# Patient Record
Sex: Male | Born: 1940 | Race: White | Hispanic: No | Marital: Married | Smoking: Former smoker
Health system: Southern US, Community
[De-identification: ages and names within clinical notes are randomized; demographics above are authoritative.]

## PROBLEM LIST (undated history)

## (undated) DIAGNOSIS — M5430 Sciatica, unspecified side: Secondary | ICD-10-CM

## (undated) DIAGNOSIS — D649 Anemia, unspecified: Secondary | ICD-10-CM

## (undated) DIAGNOSIS — M109 Gout, unspecified: Secondary | ICD-10-CM

## (undated) DIAGNOSIS — H409 Unspecified glaucoma: Secondary | ICD-10-CM

## (undated) DIAGNOSIS — G20A1 Parkinson's disease without dyskinesia, without mention of fluctuations: Secondary | ICD-10-CM

## (undated) DIAGNOSIS — M1711 Unilateral primary osteoarthritis, right knee: Secondary | ICD-10-CM

## (undated) DIAGNOSIS — K219 Gastro-esophageal reflux disease without esophagitis: Secondary | ICD-10-CM

## (undated) DIAGNOSIS — F410 Panic disorder [episodic paroxysmal anxiety] without agoraphobia: Secondary | ICD-10-CM

## (undated) DIAGNOSIS — I10 Essential (primary) hypertension: Secondary | ICD-10-CM

## (undated) DIAGNOSIS — I6523 Occlusion and stenosis of bilateral carotid arteries: Secondary | ICD-10-CM

## (undated) DIAGNOSIS — I714 Abdominal aortic aneurysm, without rupture, unspecified: Secondary | ICD-10-CM

## (undated) DIAGNOSIS — E559 Vitamin D deficiency, unspecified: Secondary | ICD-10-CM

## (undated) DIAGNOSIS — M1990 Unspecified osteoarthritis, unspecified site: Secondary | ICD-10-CM

## (undated) DIAGNOSIS — Z8719 Personal history of other diseases of the digestive system: Secondary | ICD-10-CM

## (undated) DIAGNOSIS — I493 Ventricular premature depolarization: Secondary | ICD-10-CM

## (undated) DIAGNOSIS — I723 Aneurysm of iliac artery: Secondary | ICD-10-CM

## (undated) DIAGNOSIS — E785 Hyperlipidemia, unspecified: Secondary | ICD-10-CM

## (undated) DIAGNOSIS — I739 Peripheral vascular disease, unspecified: Secondary | ICD-10-CM

## (undated) DIAGNOSIS — I639 Cerebral infarction, unspecified: Secondary | ICD-10-CM

## (undated) HISTORY — PX: EYE SURGERY: SHX253

## (undated) HISTORY — PX: NASAL SINUS SURGERY: SHX719

## (undated) HISTORY — PX: BACK SURGERY: SHX140

## (undated) HISTORY — PX: SHOULDER SURGERY: SHX246

## (undated) HISTORY — PX: COLONOSCOPY: SHX174

## (undated) HISTORY — PX: KNEE SURGERY: SHX244

---

## 2003-08-19 ENCOUNTER — Ambulatory Visit (HOSPITAL_COMMUNITY): Admission: RE | Admit: 2003-08-19 | Discharge: 2003-08-19 | Payer: Self-pay | Admitting: *Deleted

## 2003-09-17 HISTORY — PX: ABDOMINAL AORTIC ANEURYSM REPAIR: SUR1152

## 2003-09-27 ENCOUNTER — Inpatient Hospital Stay (HOSPITAL_COMMUNITY): Admission: RE | Admit: 2003-09-27 | Discharge: 2003-10-03 | Payer: Self-pay | Admitting: *Deleted

## 2004-10-05 ENCOUNTER — Ambulatory Visit: Payer: Self-pay | Admitting: Gastroenterology

## 2005-09-27 ENCOUNTER — Other Ambulatory Visit: Payer: Self-pay

## 2005-09-27 ENCOUNTER — Ambulatory Visit: Payer: Self-pay | Admitting: Otolaryngology

## 2005-09-30 ENCOUNTER — Ambulatory Visit: Payer: Self-pay | Admitting: Otolaryngology

## 2006-03-13 ENCOUNTER — Ambulatory Visit: Payer: Self-pay | Admitting: Internal Medicine

## 2007-10-30 ENCOUNTER — Ambulatory Visit: Payer: Self-pay | Admitting: Gastroenterology

## 2013-09-16 DIAGNOSIS — I639 Cerebral infarction, unspecified: Secondary | ICD-10-CM

## 2013-09-16 HISTORY — DX: Cerebral infarction, unspecified: I63.9

## 2014-04-01 ENCOUNTER — Emergency Department: Payer: Self-pay | Admitting: Emergency Medicine

## 2014-04-01 LAB — CBC
HCT: 43.1 % (ref 40.0–52.0)
HGB: 14.4 g/dL (ref 13.0–18.0)
MCH: 31.2 pg (ref 26.0–34.0)
MCHC: 33.3 g/dL (ref 32.0–36.0)
MCV: 94 fL (ref 80–100)
Platelet: 136 10*3/uL — ABNORMAL LOW (ref 150–440)
RBC: 4.61 10*6/uL (ref 4.40–5.90)
RDW: 13.3 % (ref 11.5–14.5)
WBC: 7 10*3/uL (ref 3.8–10.6)

## 2014-04-01 LAB — BASIC METABOLIC PANEL
Anion Gap: 7 (ref 7–16)
BUN: 14 mg/dL (ref 7–18)
Calcium, Total: 8.8 mg/dL (ref 8.5–10.1)
Chloride: 107 mmol/L (ref 98–107)
Co2: 25 mmol/L (ref 21–32)
Creatinine: 1.03 mg/dL (ref 0.60–1.30)
EGFR (African American): >60
EGFR (Non-African Amer.): >60
Glucose: 102 mg/dL — ABNORMAL HIGH (ref 65–99)
Osmolality: 278 (ref 275–301)
Potassium: 4.1 mmol/L (ref 3.5–5.1)
Sodium: 139 mmol/L (ref 136–145)

## 2014-04-01 LAB — TROPONIN I: Troponin-I: <0.02 ng/mL

## 2014-04-06 ENCOUNTER — Ambulatory Visit: Payer: Self-pay | Admitting: Internal Medicine

## 2014-04-07 DIAGNOSIS — I779 Disorder of arteries and arterioles, unspecified: Secondary | ICD-10-CM | POA: Insufficient documentation

## 2014-04-07 DIAGNOSIS — I739 Peripheral vascular disease, unspecified: Secondary | ICD-10-CM

## 2014-04-18 ENCOUNTER — Ambulatory Visit: Payer: Self-pay | Admitting: Internal Medicine

## 2014-04-29 DIAGNOSIS — M5430 Sciatica, unspecified side: Secondary | ICD-10-CM | POA: Insufficient documentation

## 2014-04-29 DIAGNOSIS — Z8673 Personal history of transient ischemic attack (TIA), and cerebral infarction without residual deficits: Secondary | ICD-10-CM | POA: Insufficient documentation

## 2014-04-29 DIAGNOSIS — M79605 Pain in left leg: Secondary | ICD-10-CM | POA: Insufficient documentation

## 2014-04-29 DIAGNOSIS — M549 Dorsalgia, unspecified: Secondary | ICD-10-CM | POA: Insufficient documentation

## 2014-08-03 DIAGNOSIS — Z8673 Personal history of transient ischemic attack (TIA), and cerebral infarction without residual deficits: Secondary | ICD-10-CM | POA: Insufficient documentation

## 2014-09-01 ENCOUNTER — Ambulatory Visit: Payer: Self-pay | Admitting: Neurology

## 2014-10-14 DIAGNOSIS — I714 Abdominal aortic aneurysm, without rupture, unspecified: Secondary | ICD-10-CM | POA: Insufficient documentation

## 2014-10-14 DIAGNOSIS — I10 Essential (primary) hypertension: Secondary | ICD-10-CM | POA: Insufficient documentation

## 2014-10-14 DIAGNOSIS — R7989 Other specified abnormal findings of blood chemistry: Secondary | ICD-10-CM | POA: Insufficient documentation

## 2014-10-18 ENCOUNTER — Ambulatory Visit: Payer: Self-pay | Admitting: Vascular Surgery

## 2015-02-11 IMAGING — CT CT ANGIO ABD-PELV WO/W CM
2 of 7 series · 14 of 46 positions shown, 18 images · IV contrast (APPLIED)
Comparison: MRI September 01, 2014.

CLINICAL DATA: Abdominal aortic aneurysm.

EXAM:
CTA ABDOMEN AND PELVIS wITHOUT AND WITH CONTRAST
TECHNIQUE: Multidetector CT imaging of the abdomen and pelvis was performed
using the standard protocol during bolus administration of
intravenous contrast. Multiplanar reconstructed images and MIPs were
obtained and reviewed to evaluate the vascular anatomy.
CONTRAST:  125 mL of Isovue 370 intravenously.

[Series 5: arterial · axial · arterial · 0.83mm/px · z∈[-456,-66]mm · 11 of 231 slices shown, 15 images]
[im 24/231  soft-tissue]
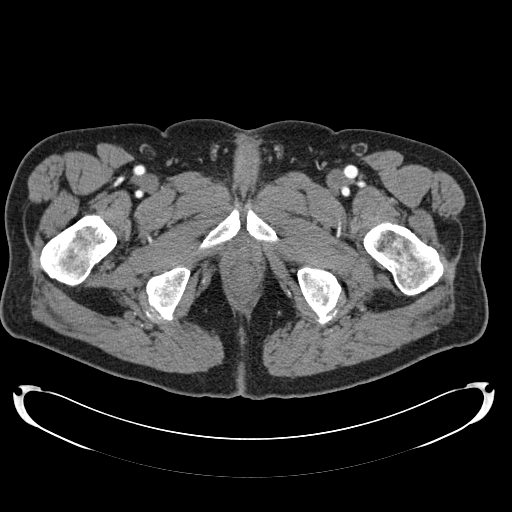
[im 24/231  bone]
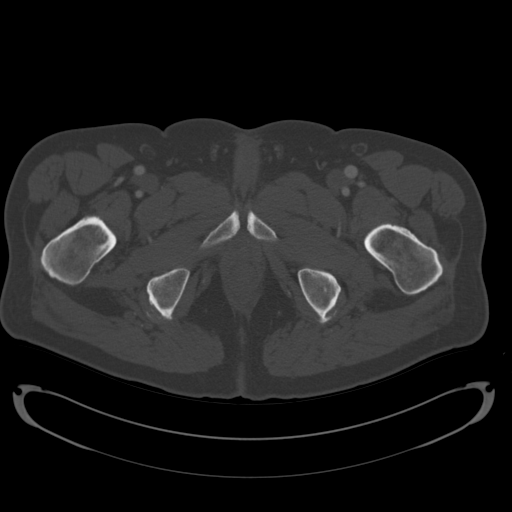
[im 47/231  soft-tissue]
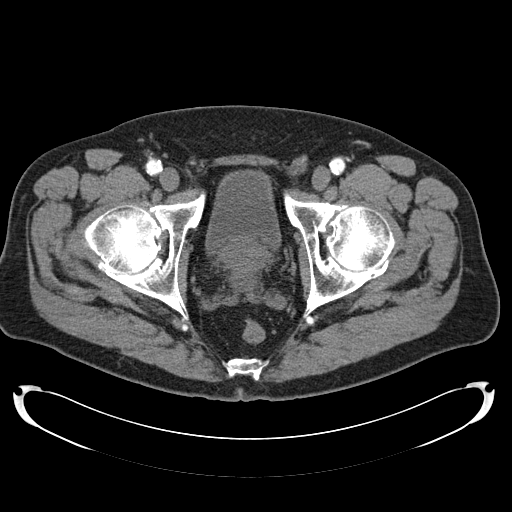
[im 70/231  soft-tissue]
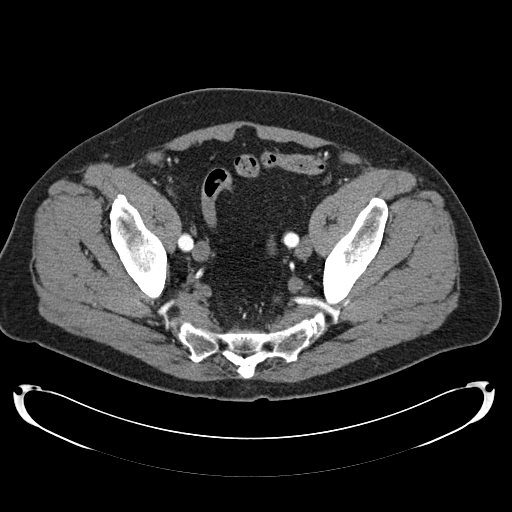
[im 93/231  soft-tissue]
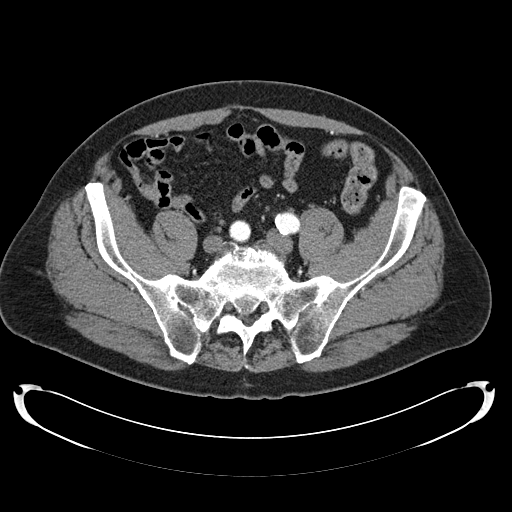
[im 116/231  soft-tissue]
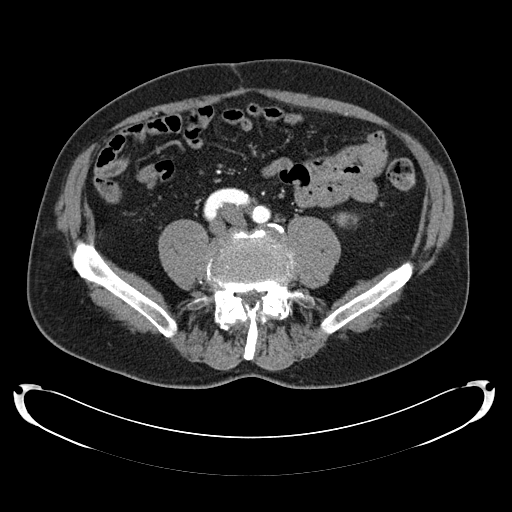
[im 139/231  soft-tissue]
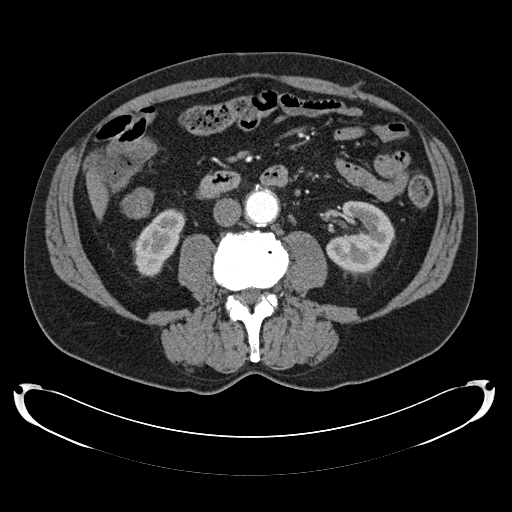
[im 162/231  soft-tissue]
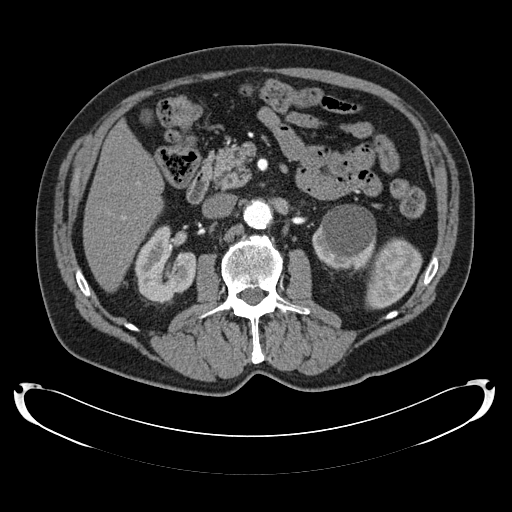
[im 185/231  soft-tissue]
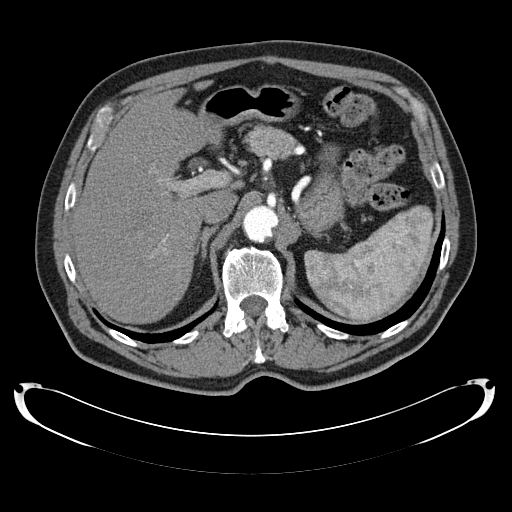
[im 185/231  lung]
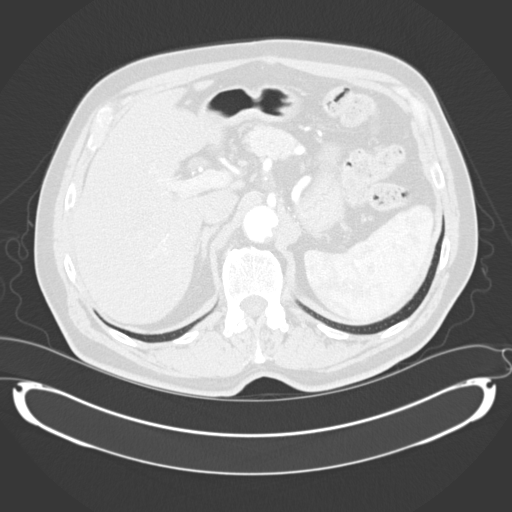
[im 196/231  lung]
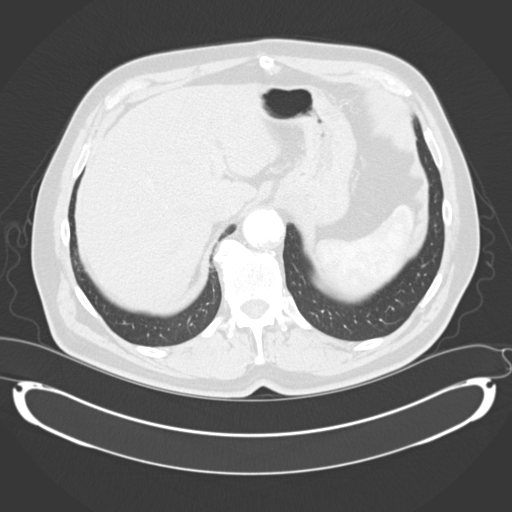
[im 208/231  soft-tissue]
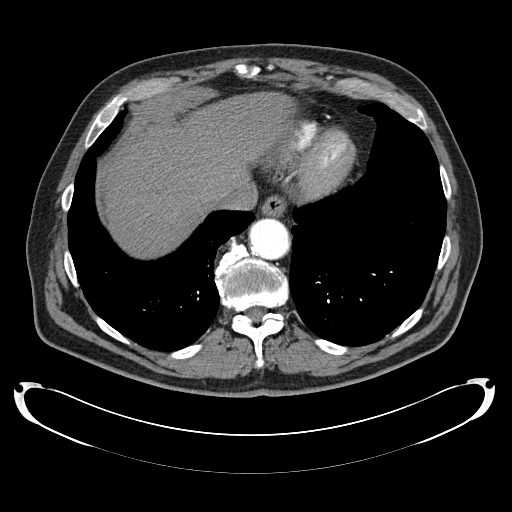
[im 208/231  lung]
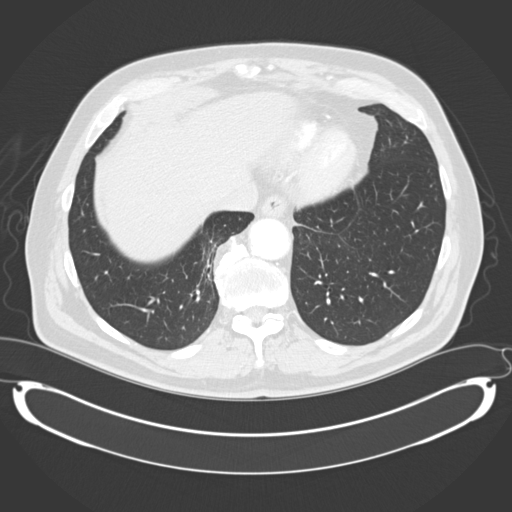
[im 208/231  bone]
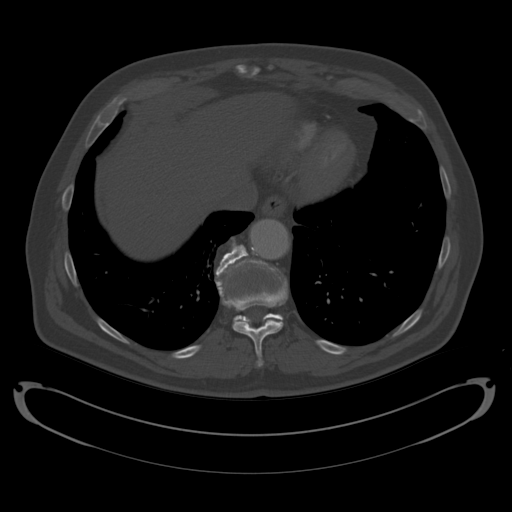
[im 219/231  lung]
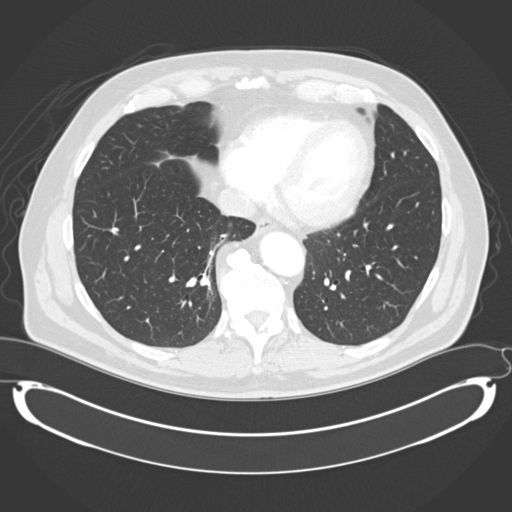

[Series 9: cor arterial mpr · coronal · arterial · 0.88mm/px · 3 of 145 slices shown]
[im 37/145  soft-tissue]
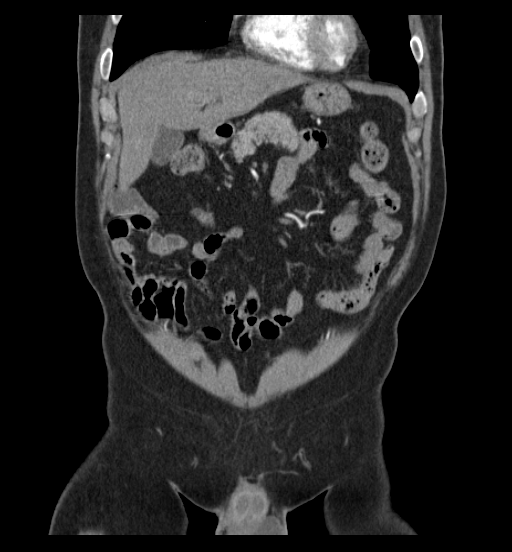
[im 73/145  soft-tissue]
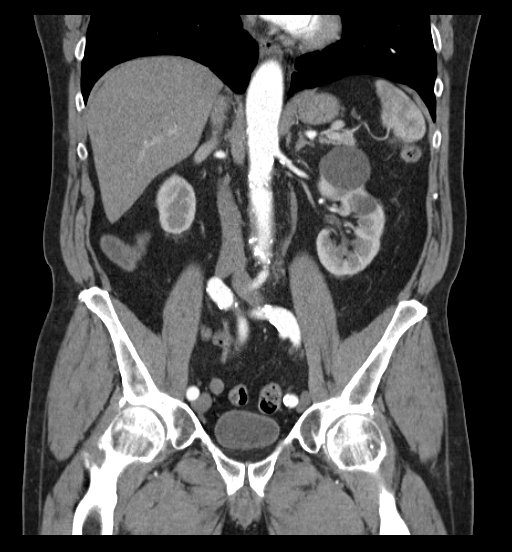
[im 109/145  soft-tissue]
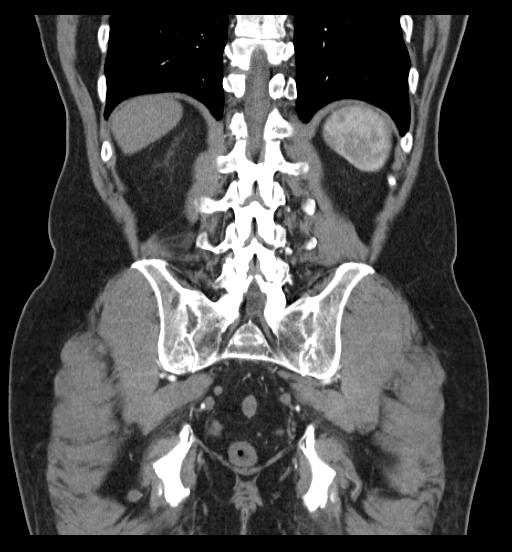

[14 of 46 positions shown; findings below may reference images not displayed]

FINDINGS: Severe degenerative disc disease is noted at L5-S1. Visualized lung
bases appear normal.

No gallstones are noted. The liver, spleen and pancreas appear
normal. Adrenal glands appear normal. No hydronephrosis or renal
obstruction is noted. 4.9 cm simple cyst is seen arising from upper
pole of left kidney. There is no evidence of bowel obstruction. No
abnormal fluid collection is noted. Urinary bladder appears normal.
Mild prostatic enlargement is noted. No significant adenopathy is
noted. The appendix appears normal.

Infrarenal abdominal aortic aneurysm is noted which measures 3.6 cm
in maximum measured AP diameter and 3.1 cm in maximum transverse
diameter. No dissection is noted. Celiac and superior mesenteric
arteries are widely patent without significant stenosis. Inferior
mesenteric artery appears to be occluded at its origin, with filling
through retrograde flow. Renal arteries appear to be widely patent.
Aneurysmal dilatation of the distal portion of the right common
iliac artery is noted just proximal above the bifurcation which
measures 2.7 x 2.3 cm in size. Tortuosity of the iliac arteries is
noted. No significant stenosis is noted in the iliac arteries.
However, severe calcified stenosis is noted in the distal portion of
the left common femoral artery just above the bifurcation.

Review of the MIP images confirms the above findings.
IMPRESSION: 3.6 x 3.1 cm infrarenal abdominal aortic aneurysm is noted.

Aneurysmal dilatation of distal portion of right common iliac artery
is noted which measures 2.7 cm in maximum diameter.

Severe calcified stenosis is noted in the distal portion of the left
common femoral artery just above the bifurcation.

## 2017-02-11 ENCOUNTER — Encounter: Payer: Self-pay | Admitting: *Deleted

## 2017-02-14 NOTE — Discharge Instructions (Signed)
[facility] °[facility] °ENDOSCOPIC SINUS SURGERY ° [facility] ° °What is Functional Endoscopic Sinus Surgery? ° The Surgery involves making the natural openings of the sinuses larger by removing the bony partitions that separate the sinuses from the nasal cavity.  The natural sinus lining is preserved as much as possible to allow the sinuses to resume normal function after the surgery.  In some patients nasal polyps (excessively swollen lining of the sinuses) may be removed to relieve obstruction of the sinus openings.  The surgery is performed through the nose using lighted scopes, which eliminates the need for incisions on the face.  A septoplasty is a different procedure which is sometimes performed with sinus surgery.  It involves straightening the boy partition that separates the two sides of your nose.  A crooked or deviated septum may need repair if is obstructing the sinuses or nasal airflow.  Turbinate reduction is also often performed during sinus surgery.  The turbinates are bony proturberances from the side walls of the nose which swell and can obstruct the nose in patients with sinus and allergy problems.  Their size can be surgically reduced to help relieve nasal obstruction. ° °What Can Sinus Surgery Do For Me? ° Sinus surgery can reduce the frequency of sinus infections requiring antibiotic treatment.  This can provide improvement in nasal congestion, post-nasal drainage, facial pressure and nasal obstruction.  Surgery will NOT prevent you from ever having an infection again, so it usually only for patients who get infections 4 or more times yearly requiring antibiotics, or for infections that do not clear with antibiotics.  It will not cure nasal allergies, so patients with allergies may still require medication to treat their allergies after surgery. Surgery may improve headaches related to sinusitis, however, some people will continue to  require medication to control sinus headaches related to allergies.  Surgery will do nothing for other forms of headache (migraine, tension or cluster). ° °What Are the Risks of Endoscopic Sinus Surgery? ° Current techniques allow surgery to be performed safely with little risk, however, there are rare complications that patients should be aware of.  Because the sinuses are located around the eyes, there is risk of eye injury, including blindness, though again, this would be quite rare. This is usually a result of bleeding behind the eye during surgery, which puts the vision oat risk, though there are treatments to protect the vision and prevent permanent disrupted by surgery causing a leak of the spinal fluid that surrounds the brain.  More serious complications would include bleeding inside the brain cavity or damage to the brain.  Again, all of these complications are uncommon, and spinal fluid leaks can be safely managed surgically if they occur.  The most common complication of sinus surgery is bleeding from the nose, which may require packing or cauterization of the nose.  Continued sinus have polyps may experience recurrence of the polyps requiring revision surgery.  Alterations of sense of smell or injury to the tear ducts are also rare complications.  ° °What is the Surgery Like, and what is the Recovery? ° The Surgery usually takes a couple of hours to perform, and is usually performed under a general anesthetic (completely asleep).  Patients are usually discharged home after a couple of hours.  Sometimes during surgery it is necessary to pack the nose to control bleeding, and the packing is left in place for 24 - 48 hours, and removed by your surgeon.    If a septoplasty was performed during the procedure, there is often a splint placed which must be removed after 5-7 days.   °Discomfort: Pain is usually mild to moderate, and can be controlled by prescription pain medication or acetaminophen (Tylenol).   Aspirin, Ibuprofen (Advil, Motrin), or Naprosyn (Aleve) should be avoided, as they can cause increased bleeding.  Most patients feel sinus pressure like they have a bad head cold for several days.  Sleeping with your head elevated can help reduce swelling and facial pressure, as can ice packs over the face.  A humidifier may be helpful to keep the mucous and blood from drying in the nose.  ° °Diet: There are no specific diet restrictions, however, you should generally start with clear liquids and a light diet of bland foods because the anesthetic can cause some nausea.  Advance your diet depending on how your stomach feels.  Taking your pain medication with food will often help reduce stomach upset which pain medications can cause. ° °Nasal Saline Irrigation: It is important to remove blood clots and dried mucous from the nose as it is healing.  This is done by having you irrigate the nose at least 3 - 4 times daily with a salt water solution.  We recommend using NeilMed Sinus Rinse (available at the drug store).  Fill the squeeze bottle with the solution, bend over a sink, and insert the tip of the squeeze bottle into the nose ½ of an inch.  Point the tip of the squeeze bottle towards the inside corner of the eye on the same side your irrigating.  Squeeze the bottle and gently irrigate the nose.  If you bend forward as you do this, most of the fluid will flow back out of the nose, instead of down your throat.   The solution should be warm, near body temperature, when you irrigate.   Each time you irrigate, you should use a full squeeze bottle.  ° °Note that if you are instructed to use Nasal Steroid Sprays at any time after your surgery, irrigate with saline BEFORE using the steroid spray, so you do not wash it all out of the nose. °Another product, Nasal Saline Gel (such as AYR Nasal Saline Gel) can be applied in each nostril 3 - 4 times daily to moisture the nose and reduce scabbing or crusting. ° °Bleeding:   Bloody drainage from the nose can be expected for several days, and patients are instructed to irrigate their nose frequently with salt water to help remove mucous and blood clots.  The drainage may be dark red or brown, though some fresh blood may be seen intermittently, especially after irrigation.  Do not blow you nose, as bleeding may occur. If you must sneeze, keep your mouth open to allow air to escape through your mouth. ° °If heavy bleeding occurs: Irrigate the nose with saline to rinse out clots, then spray the nose 3 - 4 times with Afrin Nasal Decongestant Spray.  The spray will constrict the blood vessels to slow bleeding.  Pinch the lower half of your nose shut to apply pressure, and lay down with your head elevated.  Ice packs over the nose may help as well. If bleeding persists despite these measures, you should notify your doctor.  Do not use the Afrin routinely to control nasal congestion after surgery, as it can result in worsening congestion and may affect healing.  ° ° ° °Activity: Return to work varies among patients. Most patients will be   out of work at least 5 - 7 days to recover.  Patient may return to work after they are off of narcotic pain medication, and feeling well enough to perform the functions of their job.  Patients must avoid heavy lifting (over 10 pounds) or strenuous physical for 2 weeks after surgery, so your employer may need to assign you to light duty, or keep you out of work longer if light duty is not possible.  NOTE: you should not drive, operate dangerous machinery, do any mentally demanding tasks or make any important legal or financial decisions while on narcotic pain medication and recovering from the general anesthetic.  °  °Call Your Doctor Immediately if You Have Any of the Following: °1. Bleeding that you cannot control with the above measures °2. Loss of vision, double vision, bulging of the eye or black eyes. °3. Fever over 101 degrees °4. Neck stiffness with  severe headache, fever, nausea and change in mental state. °You are always encourage to call anytime with concerns, however, please call with requests for pain medication refills during office hours. ° °Office Endoscopy: During follow-up visits your doctor will remove any packing or splints that may have been placed and evaluate and clean your sinuses endoscopically.  Topical anesthetic will be used to make this as comfortable as possible, though you may want to take your pain medication prior to the visit.  How often this will need to be done varies from patient to patient.  After complete recovery from the surgery, you may need follow-up endoscopy from time to time, particularly if there is concern of recurrent infection or nasal polyps. ° ° °General Anesthesia, Adult, Care After °These instructions provide you with information about caring for yourself after your procedure. Your health care provider may also give you more specific instructions. Your treatment has been planned according to current medical practices, but problems sometimes occur. Call your health care provider if you have any problems or questions after your procedure. °What can I expect after the procedure? °After the procedure, it is common to have: °· Vomiting. °· A sore throat. °· Mental slowness. ° °It is common to feel: °· Nauseous. °· Cold or shivery. °· Sleepy. °· Tired. °· Sore or achy, even in parts of your body where you did not have surgery. ° °Follow these instructions at home: °For at least 24 hours after the procedure: °· Do not: °? Participate in activities where you could fall or become injured. °? Drive. °? Use heavy machinery. °? Drink alcohol. °? Take sleeping pills or medicines that cause drowsiness. °? Make important decisions or sign legal documents. °? Take care of children on your own. °· Rest. °Eating and drinking °· If you vomit, drink water, juice, or soup when you can drink without vomiting. °· Drink enough fluid to  keep your urine clear or pale yellow. °· Make sure you have little or no nausea before eating solid foods. °· Follow the diet recommended by your health care provider. °General instructions °· Have a responsible adult stay with you until you are awake and alert. °· Return to your normal activities as told by your health care provider. Ask your health care provider what activities are safe for you. °· Take over-the-counter and prescription medicines only as told by your health care provider. °· If you smoke, do not smoke without supervision. °· Keep all follow-up visits as told by your health care provider. This is important. °Contact a health care provider if: °· You   continue to have nausea or vomiting at home, and medicines are not helpful. °· You cannot drink fluids or start eating again. °· You cannot urinate after 8-12 hours. °· You develop a skin rash. °· You have fever. °· You have increasing redness at the site of your procedure. °Get help right away if: °· You have difficulty breathing. °· You have chest pain. °· You have unexpected bleeding. °· You feel that you are having a life-threatening or urgent problem. °This information is not intended to replace advice given to you by your health care provider. Make sure you discuss any questions you have with your health care provider. °Document Released: 12/09/2000 Document Revised: 02/05/2016 Document Reviewed: 08/17/2015 °Elsevier Interactive Patient Education © 2018 [employer] ° °

## 2017-02-20 ENCOUNTER — Ambulatory Visit: Payer: Medicare Other | Admitting: Anesthesiology

## 2017-02-20 ENCOUNTER — Ambulatory Visit
Admission: RE | Admit: 2017-02-20 | Discharge: 2017-02-20 | Disposition: A | Discharge status: Home / Self Care | Payer: Medicare Other | Source: Ambulatory Visit | Attending: Otolaryngology | Admitting: Otolaryngology

## 2017-02-20 ENCOUNTER — Encounter
Admission: RE | Disposition: A | Discharge status: Home / Self Care | Payer: Self-pay | Source: Ambulatory Visit | Attending: Otolaryngology

## 2017-02-20 DIAGNOSIS — Z8673 Personal history of transient ischemic attack (TIA), and cerebral infarction without residual deficits: Secondary | ICD-10-CM | POA: Diagnosis not present

## 2017-02-20 DIAGNOSIS — B49 Unspecified mycosis: Secondary | ICD-10-CM | POA: Insufficient documentation

## 2017-02-20 DIAGNOSIS — Z87891 Personal history of nicotine dependence: Secondary | ICD-10-CM | POA: Insufficient documentation

## 2017-02-20 DIAGNOSIS — J3489 Other specified disorders of nose and nasal sinuses: Secondary | ICD-10-CM | POA: Diagnosis not present

## 2017-02-20 DIAGNOSIS — I10 Essential (primary) hypertension: Secondary | ICD-10-CM | POA: Diagnosis not present

## 2017-02-20 DIAGNOSIS — Z79899 Other long term (current) drug therapy: Secondary | ICD-10-CM | POA: Insufficient documentation

## 2017-02-20 DIAGNOSIS — J329 Chronic sinusitis, unspecified: Secondary | ICD-10-CM | POA: Diagnosis present

## 2017-02-20 DIAGNOSIS — J323 Chronic sphenoidal sinusitis: Secondary | ICD-10-CM | POA: Diagnosis not present

## 2017-02-20 HISTORY — PX: SINUSOTOMY: SHX291

## 2017-02-20 HISTORY — DX: Unspecified osteoarthritis, unspecified site: M19.90

## 2017-02-20 HISTORY — DX: Gastro-esophageal reflux disease without esophagitis: K21.9

## 2017-02-20 HISTORY — DX: Essential (primary) hypertension: I10

## 2017-02-20 HISTORY — DX: Gout, unspecified: M10.9

## 2017-02-20 HISTORY — DX: Cerebral infarction, unspecified: I63.9

## 2017-02-20 HISTORY — PX: IMAGE GUIDED SINUS SURGERY: SHX6570

## 2017-02-20 SURGERY — SINUS SURGERY, WITH IMAGING GUIDANCE
Anesthesia: General | Laterality: Right | Wound class: Clean Contaminated

## 2017-02-20 MED ORDER — LIDOCAINE-EPINEPHRINE 1 %-1:100000 IJ SOLN
INTRAMUSCULAR | Status: DC | PRN
  Administered 2017-02-20: 6 mL

## 2017-02-20 MED ORDER — LIDOCAINE HCL (CARDIAC) 20 MG/ML IV SOLN
INTRAVENOUS | Status: DC | PRN
  Administered 2017-02-20: 40 mg via INTRAVENOUS

## 2017-02-20 MED ORDER — ONDANSETRON HCL 4 MG/2ML IJ SOLN
INTRAMUSCULAR | Status: DC | PRN
  Administered 2017-02-20: 4 mg via INTRAVENOUS

## 2017-02-20 MED ORDER — PHENYLEPHRINE HCL 10 MG/ML IJ SOLN
INTRAMUSCULAR | Status: DC | PRN
  Administered 2017-02-20 (×2): 100 ug via INTRAVENOUS
  Administered 2017-02-20 (×4): 50 ug via INTRAVENOUS

## 2017-02-20 MED ORDER — PROPOFOL 10 MG/ML IV BOLUS
INTRAVENOUS | Status: DC | PRN
  Administered 2017-02-20: 150 mg via INTRAVENOUS

## 2017-02-20 MED ORDER — GLYCOPYRROLATE 0.2 MG/ML IJ SOLN
INTRAMUSCULAR | Status: DC | PRN
  Administered 2017-02-20: .1 mg via INTRAVENOUS

## 2017-02-20 MED ORDER — ACETAMINOPHEN 10 MG/ML IV SOLN
1000.0000 mg | Freq: Once | INTRAVENOUS | Status: AC
  Administered 2017-02-20: 1000 mg via INTRAVENOUS

## 2017-02-20 MED ORDER — DEXAMETHASONE SODIUM PHOSPHATE 4 MG/ML IJ SOLN
INTRAMUSCULAR | Status: DC | PRN
  Administered 2017-02-20: 10 mg via INTRAVENOUS

## 2017-02-20 MED ORDER — FENTANYL CITRATE (PF) 100 MCG/2ML IJ SOLN
INTRAMUSCULAR | Status: DC | PRN
  Administered 2017-02-20: 100 ug via INTRAVENOUS

## 2017-02-20 MED ORDER — EPHEDRINE SULFATE 50 MG/ML IJ SOLN
INTRAMUSCULAR | Status: DC | PRN
  Administered 2017-02-20 (×2): 10 mg via INTRAVENOUS
  Administered 2017-02-20 (×4): 5 mg via INTRAVENOUS
  Administered 2017-02-20: 10 mg via INTRAVENOUS

## 2017-02-20 MED ORDER — CEFAZOLIN SODIUM-DEXTROSE 2-4 GM/100ML-% IV SOLN
2.0000 g | Freq: Once | INTRAVENOUS | Status: AC
  Administered 2017-02-20: 2 g via INTRAVENOUS

## 2017-02-20 MED ORDER — ONDANSETRON HCL 4 MG/2ML IJ SOLN: 4.0000 mg | Freq: Once | INTRAMUSCULAR | Status: DC | PRN

## 2017-02-20 MED ORDER — OXYMETAZOLINE HCL 0.05 % NA SOLN
2.0000 | Freq: Once | NASAL | Status: AC
  Administered 2017-02-20: 2 via NASAL

## 2017-02-20 MED ORDER — MIDAZOLAM HCL 5 MG/5ML IJ SOLN
INTRAMUSCULAR | Status: DC | PRN
  Administered 2017-02-20: 2 mg via INTRAVENOUS

## 2017-02-20 MED ORDER — PHENYLEPHRINE HCL 0.5 % NA SOLN
NASAL | Status: DC | PRN
  Administered 2017-02-20: 10 mL via TOPICAL

## 2017-02-20 MED ORDER — FENTANYL CITRATE (PF) 100 MCG/2ML IJ SOLN: 25.0000 ug | INTRAMUSCULAR | Status: DC | PRN

## 2017-02-20 MED ORDER — LIDOCAINE HCL 4 % MT SOLN
OROMUCOSAL | Status: DC | PRN
  Administered 2017-02-20: 4 mL via TOPICAL

## 2017-02-20 MED ORDER — LACTATED RINGERS IV SOLN
INTRAVENOUS | Status: DC
  Administered 2017-02-20 (×2): via INTRAVENOUS

## 2017-02-20 MED ORDER — SUCCINYLCHOLINE CHLORIDE 20 MG/ML IJ SOLN
INTRAMUSCULAR | Status: DC | PRN
  Administered 2017-02-20: 100 mg via INTRAVENOUS

## 2017-02-20 SURGICAL SUPPLY — 48 items
BALLOON SINUPLASTY SYSTEM (BALLOONS) IMPLANT
BATTERY INSTRU NAVIGATION (MISCELLANEOUS) ×16 IMPLANT
CANISTER SUCT 1200ML W/VALVE (MISCELLANEOUS) ×4 IMPLANT
CATH IV 18X1 1/4 SAFELET (CATHETERS) ×4 IMPLANT
CNTNR SPEC 2.5X3XGRAD LEK (MISCELLANEOUS) ×2
COAG SUCT 10F 3.5MM HAND CTRL (MISCELLANEOUS) ×4 IMPLANT
COAGULATOR SUCT 8FR VV (MISCELLANEOUS) ×4 IMPLANT
CONT SPEC 4OZ STER OR WHT (MISCELLANEOUS) ×2
CONTAINER SPEC 2.5X3XGRAD LEK (MISCELLANEOUS) ×2 IMPLANT
DEPRESSOR TONGUE BLADE STERILE (MISCELLANEOUS) ×4 IMPLANT
DEVICE INFLATION SEID (MISCELLANEOUS) IMPLANT
DRAIN PENROSE 1/4X12 LTX (DRAIN) ×4 IMPLANT
DRAPE HEAD BAR (DRAPES) ×4 IMPLANT
DRAPE MAG INST 16X20 L/F (DRAPES) ×4 IMPLANT
ELECT REM PT RETURN 9FT ADLT (ELECTROSURGICAL) ×4
ELECTRODE REM PT RTRN 9FT ADLT (ELECTROSURGICAL) ×2 IMPLANT
GLOVE PI ULTRA LF STRL 7.5 (GLOVE) ×4 IMPLANT
GLOVE PI ULTRA NON LATEX 7.5 (GLOVE) ×4
GOWN STRL REUS W/ TWL LRG LVL3 (GOWN DISPOSABLE) ×4 IMPLANT
GOWN STRL REUS W/TWL LRG LVL3 (GOWN DISPOSABLE) ×4
IV CATH 18X1 1/4 SAFELET (CATHETERS) ×2
IV NS 500ML (IV SOLUTION) ×2
IV NS 500ML BAXH (IV SOLUTION) ×2 IMPLANT
KIT ROOM TURNOVER OR (KITS) ×4 IMPLANT
NAVIGATION MASK REG  ST (MISCELLANEOUS) ×4 IMPLANT
NEEDLE ANESTHESIA  27G X 3.5 (NEEDLE) ×2
NEEDLE ANESTHESIA 27G X 3.5 (NEEDLE) ×2 IMPLANT
NEEDLE SPNL 25GX3.5 QUINCKE BL (NEEDLE) ×4 IMPLANT
NS IRRIG 500ML POUR BTL (IV SOLUTION) ×4 IMPLANT
PACK DRAPE NASAL/ENT (PACKS) ×4 IMPLANT
PACKING NASAL EPIS 4X2.4 XEROG (MISCELLANEOUS) ×8 IMPLANT
PAD GROUND ADULT SPLIT (MISCELLANEOUS) ×4 IMPLANT
PATTIES SURGICAL .5 X3 (DISPOSABLE) ×4 IMPLANT
SHAVER DIEGO BLD STD TYPE A (BLADE) IMPLANT
SOL ANTI-FOG 6CC FOG-OUT (MISCELLANEOUS) ×2 IMPLANT
SOL FOG-OUT ANTI-FOG 6CC (MISCELLANEOUS) ×2
STRAP BODY AND KNEE 60X3 (MISCELLANEOUS) ×4 IMPLANT
SUT CHROMIC 3 0 SH 27 (SUTURE) ×4 IMPLANT
SUT ETHILON 4-0 (SUTURE) ×2
SUT ETHILON 4-0 FS2 18XMFL BLK (SUTURE) ×2
SUT PLAIN GUT 4-0 (SUTURE) ×4 IMPLANT
SUT VICRYL+ 4-0 18IN PS-4 (SUTURE) ×4 IMPLANT
SUTURE ETHLN 4-0 FS2 18XMF BLK (SUTURE) ×2 IMPLANT
SYR 3ML LL SCALE MARK (SYRINGE) ×4 IMPLANT
SYRINGE 10CC LL (SYRINGE) ×4 IMPLANT
TOWEL OR 17X26 4PK STRL BLUE (TOWEL DISPOSABLE) ×4 IMPLANT
TUBING DECLOG MULTIDEBRIDER (TUBING) IMPLANT
WATER STERILE IRR 250ML POUR (IV SOLUTION) ×4 IMPLANT

## 2017-02-20 NOTE — Anesthesia Postprocedure Evaluation (Signed)
Anesthesia Post Note  Patient: Alan CriglerChester B Stepka  Procedure(s) Performed: Procedure(s) (LRB): IMAGE GUIDED SINUS SURGERY (N/A) SINUSOTOMY right endoscopic with removal content (Right)  Patient location during evaluation: PACU Anesthesia Type: General Level of consciousness: awake and alert and oriented Pain management: satisfactory to patient Vital Signs Assessment: post-procedure vital signs reviewed and stable Respiratory status: spontaneous breathing, nonlabored ventilation and respiratory function stable Cardiovascular status: blood pressure returned to baseline and stable Postop Assessment: Adequate PO intake and No signs of nausea or vomiting Anesthetic complications: no    Cherly BeachStella, Rupert Azzara J

## 2017-02-20 NOTE — H&P (Signed)
  H&P has been reviewed and patient reexamined, and no changes necessary. To be downloaded later. 

## 2017-02-20 NOTE — Transfer of Care (Signed)
Immediate Anesthesia Transfer of Care Note  Patient: Alan CriglerChester B Lio  Procedure(s) Performed: Procedure(s) with comments: IMAGE GUIDED SINUS SURGERY (N/A) - need disk GAVE DISK TO CECE 5-15 KP SINUSOTOMY right endoscopic with removal content (Right)  Patient Location: PACU  Anesthesia Type: General  Level of Consciousness: awake, alert  and patient cooperative  Airway and Oxygen Therapy: Patient Spontanous Breathing and Patient connected to supplemental oxygen  Post-op Assessment: Post-op Vital signs reviewed, Patient's Cardiovascular Status Stable, Respiratory Function Stable, Patent Airway and No signs of Nausea or vomiting  Post-op Vital Signs: Reviewed and stable  Complications: No apparent anesthesia complications

## 2017-02-20 NOTE — Op Note (Signed)
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 This was all cleaned out. The right frontal sinus had a small string of mucus coming down and some swelling around the frontal sinus duct. This was widened. Both maxillary sinuses had small scar bands anteriorly leading to some recycling. This was cleaned out on both sides.  Procedure: The patient was given general anesthesia by oral endotracheal intubation. The nose was prepped using cottonoid pledgets soaked in phenylephrine and Xylocaine. The image guided system was brought in and the CT scan was downloaded to the system. The template was applied the face and this was registered to the system as well. There was 0.7 mm of variance. The suction isthmus were then registered the system and there was perfect alignment between the patient's anatomy and the CT scan. Alan Dennis was prepped and draped sterile fashion.  The cottonoid pledgets removed and the right side was visualized. The 0 scope was used with the image guided system to look at the sphenoid sinus. There was a 2 mm opening here with a string of mucus running down the back wall of the nasopharynx from the sinus opening. Small amount of 1% Xylocaine with epi 100,000 was used for infiltration into this area. A sphenoid punch was then used for opening up the sphenoid sinus face. On the bone you could see very thickened bone around the sinus from ostia neogenesis from  years of chronic inflammation. Once the opening was widened to about at least a 1 cm round opening, you could see inside the sinus easily and there was a very large fungus ball that was filling most the sinus. A picture was taken of this as part of it was removed and then the rest was brought out. Alan Dennis was thick white capsule around the fungus ball from chronic inflammation there. The membranes were a little bit inflamed because of the fungus ball. This was all flushed as well once the fungus ball was removed and there is no further disease noted within the sinus using no 0 and 30 scope to visualize all of the areas. Some xerogel was placed into the opening to help prevent scarring or bleeding.  The 0 and 30 scopes were used to visualize the sinuses. There was a small string of mucus coming from the right frontal sinus area and there was mucosal swelling near the opening. Using the frontal sinus image guided tool for evaluating the anatomy here and 30 scopes, the area was visualized directly. Frontal sinus instruments were used for widening the frontal sinus duct here to make sure this could drain very easily. Once this was opened xerogel was placed into the frontal sinus duct to help prevent scarring or bleeding.  The 30 scopes were used to visualize the maxillary sinus openings of both sides and there was a small scar band at its most anterior border creating some recycling. The scar band was cut through on both sides to  eliminate this and make sure the natural ostium was wide open to the rest of the maxillary antrum. Small amount of xerogel was placed both these openings as well.  The patient tolerated the procedure well. Alan Dennis was awakened taken to the recovery room in satisfactory condition. There were no operative complications.  Dispo:   To PACU to be discharged home  Plan:  To follow-up in the office in 1 week to make sure Alan Dennis is doing well. We'll start saline flushes tomorrow. Alan Dennis will take it easy  through the weekend. We will use Ceftin postop and a small prednisone taper. Use Tylenol or Tylenol with Codeine for pain if needed.  Alan Dennis H  02/20/2017 9:02 AM

## 2017-02-20 NOTE — Anesthesia Procedure Notes (Signed)
Procedure Name: Intubation Date/Time: 02/20/2017 7:39 AM Performed by: Jimmy PicketAMYOT, Alexxia Stankiewicz Pre-anesthesia Checklist: Patient identified, Emergency Drugs available, Suction available, Patient being monitored and Timeout performed Patient Re-evaluated:Patient Re-evaluated prior to inductionOxygen Delivery Method: Circle system utilized Preoxygenation: Pre-oxygenation with 100% oxygen Intubation Type: IV induction Ventilation: Mask ventilation without difficulty Laryngoscope Size: Miller and 3 Grade View: Grade II Tube type: Oral Rae Tube size: 7.5 mm Number of attempts: 1 Placement Confirmation: ETT inserted through vocal cords under direct vision,  positive ETCO2 and breath sounds checked- equal and bilateral Tube secured with: Tape Dental Injury: Teeth and Oropharynx as per pre-operative assessment

## 2017-02-20 NOTE — Anesthesia Preprocedure Evaluation (Signed)
Anesthesia Evaluation  Patient identified by MRN, date of birth, ID band Patient awake    Reviewed: Allergy & Precautions, H&P , NPO status , Patient's Chart, lab work & pertinent test results  Airway Mallampati: II  TM Distance: >3 FB Neck ROM: full    Dental no notable dental hx.    Pulmonary former smoker,    Pulmonary exam normal        Cardiovascular hypertension, Normal cardiovascular exam     Neuro/Psych CVA    GI/Hepatic GERD  ,  Endo/Other    Renal/GU      Musculoskeletal   Abdominal   Peds  Hematology   Anesthesia Other Findings   Reproductive/Obstetrics                             Anesthesia Physical Anesthesia Plan  ASA: II  Anesthesia Plan: General   Post-op Pain Management:    Induction:   PONV Risk Score and Plan: 3 and Ondansetron, Dexamethasone, Propofol, Midazolam and Treatment may vary due to age  Airway Management Planned:   Additional Equipment:   Intra-op Plan:   Post-operative Plan:   Informed Consent: I have reviewed the patients History and Physical, chart, labs and discussed the procedure including the risks, benefits and alternatives for the proposed anesthesia with the patient or authorized representative who has indicated his/her understanding and acceptance.     Plan Discussed with:   Anesthesia Plan Comments:         Anesthesia Quick Evaluation

## 2017-02-21 ENCOUNTER — Encounter: Payer: Self-pay | Admitting: Otolaryngology

## 2017-02-24 LAB — SURGICAL PATHOLOGY

## 2017-05-06 ENCOUNTER — Ambulatory Visit (INDEPENDENT_AMBULATORY_CARE_PROVIDER_SITE_OTHER): Payer: Medicare Other | Admitting: Gastroenterology

## 2017-05-06 ENCOUNTER — Encounter: Payer: Self-pay | Admitting: Gastroenterology

## 2017-05-06 ENCOUNTER — Other Ambulatory Visit: Payer: Self-pay

## 2017-05-06 VITALS — BP 194/105 | HR 71 | Temp 98.2°F | Ht 70.5 in | Wt 211.4 lb

## 2017-05-06 DIAGNOSIS — K219 Gastro-esophageal reflux disease without esophagitis: Secondary | ICD-10-CM

## 2017-05-06 MED ORDER — ESOMEPRAZOLE MAGNESIUM 40 MG PO CPDR: 40.0000 mg | DELAYED_RELEASE_CAPSULE | Freq: Every day | ORAL | 2 refills | Status: DC

## 2017-05-06 NOTE — Progress Notes (Signed)
Arlyss Repress, MD [address]  [address]  [place], [place] [address]  Main: [phone]  Fax: [phone]    Gastroenterology Consultation  Referring Provider:     Vernie Murders, MD Primary Care Physician:  Patient, No Pcp Per Primary Gastroenterologist:  Dr. Arlyss Repress Reason for Consultation:     GERD        HPI:   Alan Dennis is a 76 y.o. y/o male referred by Dr. Genevive Bi for consultation & management of GERD. He had long term sinus problems, had sinus surgeries by Dr Genevive Bi, ENT. 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  He denies any other upper GI symptoms. He is otherwise very healthy and active. Labs from care everywhere showed normal CBC, CMP in January 2018 GI Procedures: EGD none Colonoscopy ~64yrs ago, no polyps detected He does not smoke, drinks wine one to 2 glasses daily   Past Medical History:  Diagnosis Date  . Arthritis   . GERD (gastroesophageal reflux disease)   . Gout   . Hypertension   . Stroke [facility]) 2015   TIA - no deficits    Past Surgical History:  Procedure Laterality Date  . ABDOMINAL AORTIC ANEURYSM REPAIR  2005  . BACK SURGERY     spur removal, (also, major back surg in 1970s)  . IMAGE GUIDED SINUS SURGERY N/A 02/20/2017   Procedure: IMAGE GUIDED SINUS SURGERY;  Surgeon: Vernie Murders, MD;  Location: [facility];  Service: ENT;  Laterality: N/A;  need disk GAVE DISK TO CE[facility] 5-15 KP  . KNEE SURGERY Right   . NASAL SINUS SURGERY     several  . SHOULDER SURGERY Right   . SINUSOTOMY Right 02/20/2017   Procedure: SINUSOTOMY right endoscopic with removal content;  Surgeon: Vernie Murders, MD;  Location: [facility];   Service: ENT;  Laterality: Right;    Prior to Admission medications   Medication Sig Start Date End Date Taking? Authorizing Provider  allopurinol (ZYLOPRIM) 100 MG tablet Take 100 mg by mouth daily.   Yes [provider]  aspirin EC 81 MG tablet Take by mouth.   Yes [provider]  latanoprost (XALATAN) 0.005 % ophthalmic solution 1 drop at bedtime.   Yes [provider]  losartan (COZAAR) 100 MG tablet Take 100 mg by mouth daily.   Yes [provider]  Probiotic Product (PROBIOTIC-10) CAPS Take by mouth.   Yes [provider]  albuterol (PROVENTIL HFA;VENTOLIN HFA) 108 (90 Base) MCG/ACT inhaler Inhale 2 puffs into the lungs every 6 (six) hours as needed for wheezing or shortness of breath.    [provider]  cefUROXime (CEFTIN) 500 MG tablet Take 500 mg by mouth 2 (two) times daily. for 10 days 02/20/17   [provider]    No family history on file.   Social History  Substance Use Topics  . Smoking status: Former Smoker    Quit date: 1985  . Smokeless tobacco: Never Used  . Alcohol use 8.4 oz/week    14 Glasses of wine per week    Allergies as of 05/06/2017 - Review Complete 05/06/2017  Allergen Reaction Noted  . Bactrim [sulfamethoxazole-trimethoprim] Hives 02/11/2017  . Lisinopril  Other (See Comments) 04/04/2014  . Tetracycline Other (See Comments) 04/04/2014  . Tetracyclines & related Hives 02/11/2017  . Zithromax [azithromycin] Hives 02/11/2017  . Sulfa antibiotics Hives and Rash 04/04/2014    Review of Systems:    All systems reviewed and negative except where noted in HPI.   Physical Exam:  BP (!) 194/105   Pulse 71   Temp 98.2 F (36.8 C) (Oral)   Ht 5' 10.5" (1.791 m)   Wt 95.9 kg (211 lb 6.4 oz)   BMI 29.90 kg/m  No LMP for male patient.  General:   Alert,  Well-developed, well-nourished, pleasant and cooperative in NAD Head:  Normocephalic and atraumatic. Eyes:  Sclera clear, no icterus.    Conjunctiva pink. Ears:  Normal auditory acuity. Nose:  No deformity, discharge, or lesions. Mouth:  No deformity or lesions,oropharynx pink & moist. Neck:  Supple; no masses or thyromegaly. Lungs:  Respirations even and unlabored.  Clear throughout to auscultation.   No wheezes, crackles, or rhonchi. No acute distress. Heart:  Regular rate and rhythm; no murmurs, clicks, rubs, or gallops. Abdomen:  Normal bowel sounds.  No bruits.  Soft, non-tender and non-distended without masses, hepatosplenomegaly or hernias noted.  No guarding or rebound tenderness.   Rectal: Nor performed Msk:  Symmetrical without gross deformities. Good, equal movement & strength bilaterally. Pulses:  Normal pulses noted. Extremities:  No clubbing or edema.  No cyanosis. Neurologic:  Alert and oriented x3;  grossly normal neurologically. Skin:  Intact without significant lesions or rashes. No jaundice. Lymph Nodes:  No significant cervical adenopathy. Psych:  Alert and cooperative. Normal mood and affect.  Imaging Studies:   Assessment and Plan:   NICHOLI GHUMAN is a 76 y.o. y/o male with Chronic GERD, constant clearing of throat. I suspect his GERD is not completely under control. Therefore, I will start daily PPI and perform EGD to look for erosive esophagitis, Barrett's esophagus, peptic stricture.  1. Start Nexium 40mg  daily before breakfast 2. Schedule EGD given chronic GERD 3. Follow anti-reflux measures  Follow up in 3 months   Arlyss Repress, MD

## 2017-05-06 NOTE — Patient Instructions (Signed)
1. Start Nexium 40mg  daily before breakfast 2. Schedule EGD given chronic GERD 3. Follow anti-reflux measures   Please call our office to speak with my nurse Iva Lento at [phone] during business hours from 8am to 4pm if you have any questions/concerns. During after hours, you will be redirected to on call GI physician. For any emergency please call 911 or go the nearest emergency room.    Arlyss Repress, MD [address]  [address]  [place], [place] [address]  Main: [phone]  Fax: [phone]

## 2017-05-07 ENCOUNTER — Encounter: Payer: Self-pay | Admitting: *Deleted

## 2017-05-08 ENCOUNTER — Ambulatory Visit: Payer: Medicare Other | Admitting: Certified Registered"

## 2017-05-08 ENCOUNTER — Encounter: Payer: Self-pay | Admitting: *Deleted

## 2017-05-08 ENCOUNTER — Encounter
Admission: RE | Disposition: A | Discharge status: Home / Self Care | Payer: Self-pay | Source: Ambulatory Visit | Attending: Gastroenterology

## 2017-05-08 ENCOUNTER — Telehealth: Payer: Self-pay

## 2017-05-08 ENCOUNTER — Ambulatory Visit
Admission: RE | Admit: 2017-05-08 | Discharge: 2017-05-08 | Disposition: A | Discharge status: Home / Self Care | Payer: Medicare Other | Source: Ambulatory Visit | Attending: Gastroenterology | Admitting: Gastroenterology

## 2017-05-08 DIAGNOSIS — Z882 Allergy status to sulfonamides status: Secondary | ICD-10-CM | POA: Insufficient documentation

## 2017-05-08 DIAGNOSIS — Z79899 Other long term (current) drug therapy: Secondary | ICD-10-CM | POA: Diagnosis not present

## 2017-05-08 DIAGNOSIS — Z888 Allergy status to other drugs, medicaments and biological substances status: Secondary | ICD-10-CM | POA: Diagnosis not present

## 2017-05-08 DIAGNOSIS — I739 Peripheral vascular disease, unspecified: Secondary | ICD-10-CM | POA: Diagnosis not present

## 2017-05-08 DIAGNOSIS — Z881 Allergy status to other antibiotic agents status: Secondary | ICD-10-CM | POA: Diagnosis not present

## 2017-05-08 DIAGNOSIS — Z7982 Long term (current) use of aspirin: Secondary | ICD-10-CM | POA: Diagnosis not present

## 2017-05-08 DIAGNOSIS — K449 Diaphragmatic hernia without obstruction or gangrene: Secondary | ICD-10-CM | POA: Diagnosis not present

## 2017-05-08 DIAGNOSIS — M109 Gout, unspecified: Secondary | ICD-10-CM | POA: Insufficient documentation

## 2017-05-08 DIAGNOSIS — Z87891 Personal history of nicotine dependence: Secondary | ICD-10-CM | POA: Insufficient documentation

## 2017-05-08 DIAGNOSIS — Z8673 Personal history of transient ischemic attack (TIA), and cerebral infarction without residual deficits: Secondary | ICD-10-CM | POA: Insufficient documentation

## 2017-05-08 DIAGNOSIS — K219 Gastro-esophageal reflux disease without esophagitis: Secondary | ICD-10-CM | POA: Diagnosis not present

## 2017-05-08 DIAGNOSIS — I10 Essential (primary) hypertension: Secondary | ICD-10-CM | POA: Diagnosis not present

## 2017-05-08 DIAGNOSIS — M1990 Unspecified osteoarthritis, unspecified site: Secondary | ICD-10-CM | POA: Diagnosis not present

## 2017-05-08 DIAGNOSIS — R12 Heartburn: Secondary | ICD-10-CM | POA: Diagnosis present

## 2017-05-08 HISTORY — PX: ESOPHAGOGASTRODUODENOSCOPY (EGD) WITH PROPOFOL: SHX5813

## 2017-05-08 SURGERY — ESOPHAGOGASTRODUODENOSCOPY (EGD) WITH PROPOFOL
Anesthesia: General

## 2017-05-08 MED ORDER — LIDOCAINE 2% (20 MG/ML) 5 ML SYRINGE
INTRAMUSCULAR | Status: DC | PRN
  Administered 2017-05-08: 25 mg via INTRAVENOUS

## 2017-05-08 MED ORDER — PROPOFOL 500 MG/50ML IV EMUL
INTRAVENOUS | Status: AC
  Filled 2017-05-08: qty 50

## 2017-05-08 MED ORDER — SODIUM CHLORIDE 0.9 % IV SOLN
INTRAVENOUS | Status: DC
Start: 2017-05-08 — End: 2017-05-08
  Administered 2017-05-08: 08:00:00 via INTRAVENOUS

## 2017-05-08 MED ORDER — PROPOFOL 10 MG/ML IV BOLUS
INTRAVENOUS | Status: DC | PRN
  Administered 2017-05-08 (×2): 50 mg via INTRAVENOUS

## 2017-05-08 MED ORDER — PROPOFOL 500 MG/50ML IV EMUL
INTRAVENOUS | Status: DC | PRN
  Administered 2017-05-08: 120 ug/kg/min via INTRAVENOUS

## 2017-05-08 NOTE — Anesthesia Preprocedure Evaluation (Signed)
Anesthesia Evaluation  Patient identified by MRN, date of birth, ID band Patient awake    Reviewed: Allergy & Precautions, H&P , NPO status , Patient's Chart, lab work & pertinent test results, reviewed documented beta blocker date and time   History of Anesthesia Complications Negative for: history of anesthetic complications  Airway Mallampati: III  TM Distance: >3 FB Neck ROM: full    Dental  (+) Caps, Poor Dentition, Chipped, Dental Advidsory Given, Missing   Pulmonary neg pulmonary ROS, former smoker,           Cardiovascular Exercise Tolerance: Good hypertension, (-) angina+ Peripheral Vascular Disease (AAA s/p repair in 2005)  (-) CAD, (-) Past MI, (-) Cardiac Stents and (-) CABG (-) dysrhythmias (-) Valvular Problems/Murmurs     Neuro/Psych neg Seizures CVA, No Residual Symptoms negative psych ROS   GI/Hepatic Neg liver ROS, GERD  ,  Endo/Other  negative endocrine ROS  Renal/GU negative Renal ROS  negative genitourinary   Musculoskeletal   Abdominal   Peds  Hematology negative hematology ROS (+)   Anesthesia Other Findings Past Medical History: No date: Arthritis No date: GERD (gastroesophageal reflux disease) No date: Gout No date: Hypertension 2015: Stroke (HCC)     Comment:  TIA - no deficits   Reproductive/Obstetrics negative OB ROS                             Anesthesia Physical Anesthesia Plan  ASA: II  Anesthesia Plan: General   Post-op Pain Management:    Induction: Intravenous  PONV Risk Score and Plan: 2 and Propofol infusion  Airway Management Planned: Natural Airway and Nasal Cannula  Additional Equipment:   Intra-op Plan:   Post-operative Plan:   Informed Consent: I have reviewed the patients History and Physical, chart, labs and discussed the procedure including the risks, benefits and alternatives for the proposed anesthesia with the patient or  authorized representative who has indicated his/her understanding and acceptance.   Dental Advisory Given  Plan Discussed with: Anesthesiologist, CRNA and Surgeon  Anesthesia Plan Comments:         Anesthesia Quick Evaluation

## 2017-05-08 NOTE — Telephone Encounter (Signed)
Patient contacted office to let me know he did need rx for nexium sent to pharmacy.  Rx for Nexium has been called to [facility].

## 2017-05-08 NOTE — Op Note (Signed)
[facility] Gastroenterology Patient Name: Alan Dennis Procedure Date: 05/08/2017 8:12 AM MRN: [id number] Account #: [id number] Date of Birth: Apr 17, 1941 Admit Type: Outpatient Age: 76 Room: [facility] ENDO ROOM 4 Gender: Male Note Status: Finalized Procedure:            Upper GI endoscopy Indications:          Heartburn, Constant clearing of throat Providers:            Lin Landsman MD, MD Referring MD:         Tracie Harrier, MD (Referring MD) Medicines:            Monitored Anesthesia Care Complications:        No immediate complications. Estimated blood loss: None. Procedure:            Pre-Anesthesia Assessment:                       - Prior to the procedure, a History and Physical was                        performed, and patient medications and allergies were                        reviewed. The patient is competent. The risks and                        benefits of the procedure and the sedation options and                        risks were discussed with the patient. All questions                        were answered and informed consent was obtained.                        Patient identification and proposed procedure were                        verified by the physician, the nurse, the                        anesthesiologist, the anesthetist and the technician in                        the pre-procedure area in the procedure room. Mental                        Status Examination: alert and oriented. Airway                        Examination: normal oropharyngeal airway and neck                        mobility. Respiratory Examination: clear to                        auscultation. CV Examination: normal. Prophylactic                        Antibiotics: The patient does not require prophylactic  antibiotics. Prior Anticoagulants: The patient has                        taken aspirin, last dose was 1 day prior to procedure.                     ASA Grade Assessment: II - A patient with mild systemic                        disease. After reviewing the risks and benefits, the                        patient was deemed in satisfactory condition to undergo                        the procedure. The anesthesia plan was to use monitored                        anesthesia care (MAC). Immediately prior to                        administration of medications, the patient was                        re-assessed for adequacy to receive sedatives. The                        heart rate, respiratory rate, oxygen saturations, blood                        pressure, adequacy of pulmonary ventilation, and                        response to care were monitored throughout the                        procedure. The physical status of the patient was                        re-assessed after the procedure.                       After obtaining informed consent, the endoscope was                        passed under direct vision. Throughout the procedure,                        the patient's blood pressure, pulse, and oxygen                        saturations were monitored continuously. The Endoscope                        was introduced through the mouth, and advanced to the                        second part of duodenum. The upper GI endoscopy was  accomplished without difficulty. The patient tolerated                        the procedure well. Findings:      The duodenal bulb and second portion of the duodenum were normal.      The entire examined stomach was normal.      A small hiatal hernia was present.      The gastroesophageal junction and examined esophagus were normal. Impression:           - Normal duodenal bulb and second portion of the                        duodenum.                       - Normal stomach.                       - Small hiatal hernia.                       - Normal gastroesophageal  junction and esophagus.                       - No specimens collected. Recommendation:       - Discharge patient to home.                       - Resume regular diet.                       - Continue present medications. Procedure Code(s):    --- Professional ---                       [phone], Esophagogastroduodenoscopy, flexible, transoral;                        diagnostic, including collection of specimen(s) by                        brushing or washing, when performed (separate procedure) Diagnosis Code(s):    --- Professional ---                       K44.9, Diaphragmatic hernia without obstruction or                        gangrene                       R12, Heartburn CPT copyright 2016 American Medical Association. All rights reserved. The codes documented in this report are preliminary and upon coder review may  be revised to meet current compliance requirements. Dr. Ulyess Mort Lin Landsman MD, MD 05/08/2017 8:34:25 AM This report has been signed electronically. Number of Addenda: 0 Note Initiated On: 05/08/2017 8:12 AM      [facility]

## 2017-05-08 NOTE — Anesthesia Post-op Follow-up Note (Signed)
Anesthesia QCDR form completed.        

## 2017-05-08 NOTE — Anesthesia Postprocedure Evaluation (Signed)
Anesthesia Post Note  Patient: Alan Dennis  Procedure(s) Performed: Procedure(s) (LRB): ESOPHAGOGASTRODUODENOSCOPY (EGD) WITH PROPOFOL (N/A)  Patient location during evaluation: Endoscopy Anesthesia Type: General Level of consciousness: awake and alert Pain management: pain level controlled Vital Signs Assessment: post-procedure vital signs reviewed and stable Respiratory status: spontaneous breathing, nonlabored ventilation, respiratory function stable and patient connected to nasal cannula oxygen Cardiovascular status: blood pressure returned to baseline and stable Postop Assessment: no signs of nausea or vomiting Anesthetic complications: no     Last Vitals:  Vitals:   05/08/17 0849 05/08/17 0859  BP: (!) 162/81 (!) 173/96  Pulse: (!) 54 63  Resp: 15 12  Temp:    SpO2: 100% 98%    Last Pain:  Vitals:   05/08/17 0829  TempSrc: Tympanic                 Lenard Simmer

## 2017-05-08 NOTE — H&P (Signed)
Arlyss Repress, MD [address]  [address]  [place], [place] [address]  Main: [phone]  Fax: [phone] Pager: [phone]  Primary Care Physician:  Barbette Reichmann, MD Primary Gastroenterologist:  Dr. Arlyss Repress  Pre-Procedure History & Physical: HPI:  Alan Dennis is a 76 y.o. male is here for an endoscopy.   Past Medical History:  Diagnosis Date  . Arthritis   . GERD (gastroesophageal reflux disease)   . Gout   . Hypertension   . Stroke [facility]) 2015   TIA - no deficits    Past Surgical History:  Procedure Laterality Date  . ABDOMINAL AORTIC ANEURYSM REPAIR  2005  . BACK SURGERY     spur removal, (also, major back surg in 1970s)  . IMAGE GUIDED SINUS SURGERY N/A 02/20/2017   Procedure: IMAGE GUIDED SINUS SURGERY;  Surgeon: Vernie Murders, MD;  Location: [facility];  Service: ENT;  Laterality: N/A;  need disk GAVE DISK TO CE[facility] 5-15 KP  . KNEE SURGERY Right   . NASAL SINUS SURGERY     several  . SHOULDER SURGERY Right   . SINUSOTOMY Right 02/20/2017   Procedure: SINUSOTOMY right endoscopic with removal content;  Surgeon: Vernie Murders, MD;  Location: [facility];  Service: ENT;  Laterality: Right;    Prior to Admission medications   Medication Sig Start Date End Date Taking? Authorizing Provider  allopurinol (ZYLOPRIM) 100 MG tablet Take 100 mg by mouth daily.   Yes [provider]  aspirin EC 81 MG tablet Take 162 mg by mouth daily.    Yes [provider]  esomeprazole (NEXIUM) 40 MG capsule Take 1 capsule (40 mg total) by mouth daily before breakfast. Take before breakfast 05/06/17 06/05/17 Yes Vanga, Loel Dubonnet, MD  latanoprost (XALATAN) 0.005 % ophthalmic solution 1 drop at bedtime.   Yes [provider]  losartan (COZAAR) 100 MG tablet Take 100 mg by mouth daily.   Yes [provider]  Probiotic Product (PROBIOTIC-10) CAPS Take by mouth.   Yes [provider]  cefUROXime  (CEFTIN) 500 MG tablet Take 500 mg by mouth 2 (two) times daily. for 10 days 02/20/17   [provider]    Allergies as of 05/07/2017 - Review Complete 05/07/2017  Allergen Reaction Noted  . Bactrim [sulfamethoxazole-trimethoprim] Hives 02/11/2017  . Lisinopril Other (See Comments) 04/04/2014  . Tetracycline Other (See Comments) 04/04/2014  . Tetracyclines & related Hives 02/11/2017  . Zithromax [azithromycin] Hives 02/11/2017  . Sulfa antibiotics Hives and Rash 04/04/2014    History reviewed. No pertinent family history.  Social History   Social History  . Marital status: Married    Spouse name: N/A  . Number of children: N/A  . Years of education: N/A   Occupational History  . Not on file.   Social History Main Topics  . Smoking status: Former Smoker    Quit date: 1985  . Smokeless tobacco: Never Used  . Alcohol use 8.4 oz/week    14 Glasses of wine per week  . Drug use: No  . Sexual activity: Not on file   Other Topics Concern  . Not on file   Social History Narrative  . No narrative on file    Review of Systems: See HPI, otherwise negative ROS  Physical Exam: BP (!) 190/98   Pulse 80   Temp (!) 96.6 F (35.9 C) (Tympanic)   Resp 18   Wt 94.3 kg (208 lb)   SpO2 100%  BMI 29.42 kg/m  General:   Alert,  pleasant and cooperative in NAD Head:  Normocephalic and atraumatic. Neck:  Supple; no masses or thyromegaly. Lungs:  Clear throughout to auscultation.    Heart:  Regular rate and rhythm. Abdomen:  Soft, nontender and nondistended. Normal bowel sounds, without guarding, and without rebound.   Neurologic:  Alert and  oriented x4;  grossly normal neurologically.  Impression/Plan: Alan Dennis is here for an endoscopy to be performed for chronic GERD  Risks, benefits, limitations, and alternatives regarding  endoscopy have been reviewed with the patient.  Questions have been answered.  All parties agreeable.   Lannette Donath, MD   05/08/2017, 8:09 AM

## 2017-05-08 NOTE — Transfer of Care (Signed)
Immediate Anesthesia Transfer of Care Note  Patient: Alan Dennis  Procedure(s) Performed: Procedure(s): ESOPHAGOGASTRODUODENOSCOPY (EGD) WITH PROPOFOL (N/A)  Patient Location: Endoscopy Unit  Anesthesia Type:General  Level of Consciousness: awake, alert  and oriented  Airway & Oxygen Therapy: Patient Spontanous Breathing and Patient connected to nasal cannula oxygen  Post-op Assessment: Report given to RN and Post -op Vital signs reviewed and stable  Post vital signs: Reviewed  Last Vitals:  Vitals:   05/08/17 0730 05/08/17 0829  BP: (!) 190/98 121/70  Pulse: 80 64  Resp: 18 18  Temp: (!) 35.9 C (!) 36.1 C  SpO2: 100% 98%    Last Pain:  Vitals:   05/08/17 0730  TempSrc: Tympanic         Complications: No apparent anesthesia complications

## 2017-05-16 ENCOUNTER — Encounter
Admission: RE | Admit: 2017-05-16 | Discharge: 2017-05-16 | Disposition: A | Discharge status: Home / Self Care | Payer: Medicare Other | Source: Ambulatory Visit | Attending: Orthopedic Surgery | Admitting: Orthopedic Surgery

## 2017-05-16 DIAGNOSIS — I10 Essential (primary) hypertension: Secondary | ICD-10-CM | POA: Insufficient documentation

## 2017-05-16 DIAGNOSIS — Z01818 Encounter for other preprocedural examination: Secondary | ICD-10-CM | POA: Insufficient documentation

## 2017-05-16 HISTORY — DX: Personal history of other diseases of the digestive system: Z87.19

## 2017-05-16 NOTE — Patient Instructions (Signed)
  Your procedure is scheduled on: Thurs. 05/22/17 Report to Day Surgery. To find out your arrival time please call [phone] between 1PM - 3PM on Wed. 05/21/17.  Remember: Instructions that are not followed completely may result in serious medical risk, up to and including death, or upon the discretion of your surgeon and anesthesiologist your surgery may need to be rescheduled.    _x___ 1. Do not eat food after midnight the night before your procedure. No gum chewing or hard candies. You may drink clear liquids up to 2 hours before you are scheduled to arrive for your surgery- DO not drink clear liquids within 2 hours of the start of your surgery.  Clear Liquids include: water, apple juice without pulp, clear carbohydrate drink such as Clearfast of Gartorade, Black Coffee or Tea (Do not add anything to coffee or tea).    _x___ 2. No Alcohol for 24 hours before or after surgery.   ____ 3. Do Not Smoke For 24 Hours Prior to Your Surgery.   ____ 4. Bring all medications with you on the day of surgery if instructed.    __x__ 5. Notify your doctor if there is any change in your medical condition     (cold, fever, infections).       Do not wear jewelry, make-up, hairpins, clips or nail polish.  Do not wear lotions, powders, or perfumes. You may wear deodorant.  Do not shave 48 hours prior to surgery. Men may shave face and neck.  Do not bring valuables to the hospital.    [facility] belongings or valuables.               Contacts, dentures or bridgework may not be worn into surgery.  Leave your suitcase in the car. After surgery it may be brought to your room.  For patients admitted to the hospital, discharge time is determined by your                treatment team.   Patients discharged the day of surgery will not be allowed to drive home.   Please read over the following fact sheets that you were given:      __x__ Take these medicines the  morning of surgery with A SIP OF WATER:    1. acetaminophen (TYLENOL) 500 MG tablet is needed  2. esomeprazole (NEXIUM) 40 MG capsule  3. losartan (COZAAR) 100 MG tablet  4.  5.  6.  ____ Fleet Enema (as directed)   __x__ Use CHG Soap as directed  ____ Use inhalers on the day of surgery  ____ Stop metformin 2 days prior to surgery    ____ Take 1/2 of usual insulin dose the night before surgery and none on the morning of surgery. __x__ Stop aspirin on 05/17/17  ____ Stop Anti-inflammatories on    ____ Stop supplements until after surgery.    ____ Bring C-Pap to the hospital.

## 2017-05-21 MED ORDER — CEFAZOLIN SODIUM-DEXTROSE 2-4 GM/100ML-% IV SOLN
2.0000 g | Freq: Once | INTRAVENOUS | Status: AC
  Administered 2017-05-22: 2 g via INTRAVENOUS

## 2017-05-22 ENCOUNTER — Encounter: Payer: Self-pay | Admitting: *Deleted

## 2017-05-22 ENCOUNTER — Ambulatory Visit: Payer: Medicare Other | Admitting: Anesthesiology

## 2017-05-22 ENCOUNTER — Ambulatory Visit
Admission: RE | Admit: 2017-05-22 | Discharge: 2017-05-22 | Disposition: A | Discharge status: Home / Self Care | Payer: Medicare Other | Source: Ambulatory Visit | Attending: Orthopedic Surgery | Admitting: Orthopedic Surgery

## 2017-05-22 ENCOUNTER — Encounter
Admission: RE | Disposition: A | Discharge status: Home / Self Care | Payer: Self-pay | Source: Ambulatory Visit | Attending: Orthopedic Surgery

## 2017-05-22 DIAGNOSIS — Z87891 Personal history of nicotine dependence: Secondary | ICD-10-CM | POA: Insufficient documentation

## 2017-05-22 DIAGNOSIS — Z79899 Other long term (current) drug therapy: Secondary | ICD-10-CM | POA: Insufficient documentation

## 2017-05-22 DIAGNOSIS — Z888 Allergy status to other drugs, medicaments and biological substances status: Secondary | ICD-10-CM | POA: Insufficient documentation

## 2017-05-22 DIAGNOSIS — Z7982 Long term (current) use of aspirin: Secondary | ICD-10-CM | POA: Insufficient documentation

## 2017-05-22 DIAGNOSIS — I739 Peripheral vascular disease, unspecified: Secondary | ICD-10-CM | POA: Insufficient documentation

## 2017-05-22 DIAGNOSIS — M109 Gout, unspecified: Secondary | ICD-10-CM | POA: Insufficient documentation

## 2017-05-22 DIAGNOSIS — Z882 Allergy status to sulfonamides status: Secondary | ICD-10-CM | POA: Diagnosis not present

## 2017-05-22 DIAGNOSIS — M1990 Unspecified osteoarthritis, unspecified site: Secondary | ICD-10-CM | POA: Insufficient documentation

## 2017-05-22 DIAGNOSIS — Z8673 Personal history of transient ischemic attack (TIA), and cerebral infarction without residual deficits: Secondary | ICD-10-CM | POA: Diagnosis not present

## 2017-05-22 DIAGNOSIS — K219 Gastro-esophageal reflux disease without esophagitis: Secondary | ICD-10-CM | POA: Diagnosis not present

## 2017-05-22 DIAGNOSIS — Z881 Allergy status to other antibiotic agents status: Secondary | ICD-10-CM | POA: Insufficient documentation

## 2017-05-22 DIAGNOSIS — I719 Aortic aneurysm of unspecified site, without rupture: Secondary | ICD-10-CM | POA: Diagnosis not present

## 2017-05-22 DIAGNOSIS — I10 Essential (primary) hypertension: Secondary | ICD-10-CM | POA: Insufficient documentation

## 2017-05-22 DIAGNOSIS — M67432 Ganglion, left wrist: Secondary | ICD-10-CM | POA: Diagnosis not present

## 2017-05-22 HISTORY — PX: GANGLION CYST EXCISION: SHX1691

## 2017-05-22 SURGERY — EXCISION, GANGLION CYST, WRIST
Anesthesia: General | Laterality: Left

## 2017-05-22 MED ORDER — FENTANYL CITRATE (PF) 100 MCG/2ML IJ SOLN
INTRAMUSCULAR | Status: DC | PRN
  Administered 2017-05-22 (×3): 25 ug via INTRAVENOUS

## 2017-05-22 MED ORDER — PROPOFOL 10 MG/ML IV BOLUS
INTRAVENOUS | Status: AC
  Filled 2017-05-22: qty 20

## 2017-05-22 MED ORDER — HYDROCODONE-ACETAMINOPHEN 5-325 MG PO TABS: 1.0000 | ORAL_TABLET | Freq: Four times a day (QID) | ORAL | 0 refills | Status: DC | PRN

## 2017-05-22 MED ORDER — FENTANYL CITRATE (PF) 100 MCG/2ML IJ SOLN
INTRAMUSCULAR | Status: AC
  Filled 2017-05-22: qty 2

## 2017-05-22 MED ORDER — LIDOCAINE HCL (PF) 2 % IJ SOLN
INTRAMUSCULAR | Status: AC
  Filled 2017-05-22: qty 2

## 2017-05-22 MED ORDER — LACTATED RINGERS IV SOLN
INTRAVENOUS | Status: DC
  Administered 2017-05-22: 09:00:00 via INTRAVENOUS

## 2017-05-22 MED ORDER — CEFAZOLIN SODIUM-DEXTROSE 2-4 GM/100ML-% IV SOLN
INTRAVENOUS | Status: AC
  Filled 2017-05-22: qty 100

## 2017-05-22 MED ORDER — PROPOFOL 10 MG/ML IV BOLUS
INTRAVENOUS | Status: DC | PRN
  Administered 2017-05-22: 200 mg via INTRAVENOUS

## 2017-05-22 MED ORDER — ONDANSETRON HCL 4 MG/2ML IJ SOLN: 4.0000 mg | Freq: Once | INTRAMUSCULAR | Status: DC | PRN

## 2017-05-22 MED ORDER — DEXAMETHASONE SODIUM PHOSPHATE 10 MG/ML IJ SOLN
INTRAMUSCULAR | Status: DC | PRN
  Administered 2017-05-22: 4 mg via INTRAVENOUS

## 2017-05-22 MED ORDER — ONDANSETRON HCL 4 MG/2ML IJ SOLN
INTRAMUSCULAR | Status: DC | PRN
  Administered 2017-05-22: 4 mg via INTRAVENOUS

## 2017-05-22 MED ORDER — FENTANYL CITRATE (PF) 100 MCG/2ML IJ SOLN: 25.0000 ug | INTRAMUSCULAR | Status: DC | PRN

## 2017-05-22 SURGICAL SUPPLY — 26 items
BANDAGE ACE 3X5.8 VEL STRL LF (GAUZE/BANDAGES/DRESSINGS) ×3 IMPLANT
BANDAGE ELASTIC 3 LF NS (GAUZE/BANDAGES/DRESSINGS) ×3 IMPLANT
CANISTER SUCT 1200ML W/VALVE (MISCELLANEOUS) ×3 IMPLANT
CAST PADDING 3X4FT ST 30246 (SOFTGOODS) ×2
CHLORAPREP W/TINT 26ML (MISCELLANEOUS) ×3 IMPLANT
CUFF TOURN 18 STER (MISCELLANEOUS) ×3 IMPLANT
ELECT CAUTERY NEEDLE 2.0 MIC (NEEDLE) ×3 IMPLANT
GAUZE PETRO XEROFOAM 1X8 (MISCELLANEOUS) ×3 IMPLANT
GAUZE SPONGE 4X4 12PLY STRL (GAUZE/BANDAGES/DRESSINGS) ×3 IMPLANT
GAUZE XEROFORM 4X4 STRL (GAUZE/BANDAGES/DRESSINGS) ×3 IMPLANT
GLOVE SURG SYN 9.0  PF PI (GLOVE) ×2
GLOVE SURG SYN 9.0 PF PI (GLOVE) ×1 IMPLANT
GOWN SRG 2XL LVL 4 RGLN SLV (GOWNS) ×1 IMPLANT
GOWN STRL NON-REIN 2XL LVL4 (GOWNS) ×2
GOWN STRL REUS W/ TWL LRG LVL3 (GOWN DISPOSABLE) ×1 IMPLANT
GOWN STRL REUS W/TWL LRG LVL3 (GOWN DISPOSABLE) ×2
KIT RM TURNOVER STRD PROC AR (KITS) ×3 IMPLANT
NS IRRIG 500ML POUR BTL (IV SOLUTION) ×3 IMPLANT
PACK EXTREMITY ARMC (MISCELLANEOUS) ×3 IMPLANT
PAD CAST CTTN 3X4 STRL (SOFTGOODS) ×1 IMPLANT
PAD CAST CTTN 4X4 STRL (SOFTGOODS) ×1 IMPLANT
PADDING CAST COTTON 4X4 STRL (SOFTGOODS) ×2
SPLINT CAST 1 STEP 3X12 (MISCELLANEOUS) ×3 IMPLANT
SUT ETHILON 4-0 (SUTURE) ×2
SUT ETHILON 4-0 FS2 18XMFL BLK (SUTURE) ×1
SUTURE ETHLN 4-0 FS2 18XMF BLK (SUTURE) ×1 IMPLANT

## 2017-05-22 NOTE — Anesthesia Preprocedure Evaluation (Signed)
Anesthesia Evaluation  Patient identified by MRN, date of birth, ID band Patient awake    Reviewed: Allergy & Precautions, H&P , NPO status , Patient's Chart, lab work & pertinent test results, reviewed documented beta blocker date and time   Airway Mallampati: II  TM Distance: >3 FB Neck ROM: full    Dental  (+) Teeth Intact   Pulmonary neg pulmonary ROS, former smoker,    Pulmonary exam normal        Cardiovascular Exercise Tolerance: Poor hypertension, On Medications + Peripheral Vascular Disease  negative cardio ROS Normal cardiovascular exam Rate:Normal     Neuro/Psych  Neuromuscular disease CVA negative neurological ROS  negative psych ROS   GI/Hepatic negative GI ROS, Neg liver ROS, hiatal hernia, GERD  Medicated,  Endo/Other  negative endocrine ROS  Renal/GU negative Renal ROS  negative genitourinary   Musculoskeletal   Abdominal   Peds  Hematology negative hematology ROS (+)   Anesthesia Other Findings   Reproductive/Obstetrics negative OB ROS                             Anesthesia Physical Anesthesia Plan  ASA: III  Anesthesia Plan: General LMA   Post-op Pain Management:    Induction:   PONV Risk Score and Plan: 3 and Ondansetron, Dexamethasone, Midazolam and Propofol infusion  Airway Management Planned:   Additional Equipment:   Intra-op Plan:   Post-operative Plan:   Informed Consent: I have reviewed the patients History and Physical, chart, labs and discussed the procedure including the risks, benefits and alternatives for the proposed anesthesia with the patient or authorized representative who has indicated his/her understanding and acceptance.     Plan Discussed with: CRNA  Anesthesia Plan Comments:         Anesthesia Quick Evaluation

## 2017-05-22 NOTE — Op Note (Signed)
05/22/2017  9:36 AM  PATIENT:  Alan Dennis  76 y.o. male  PRE-OPERATIVE DIAGNOSIS:  GANGLION CYST LEFT WRIST volar  POST-OPERATIVE DIAGNOSIS:  GANGLION CYST LEFT WRIST volar  PROCEDURE:  Procedure(s): REMOVAL GANGLION OF WRIST (Left) volar  SURGEON: Leitha SchullerMichael J Kinnie Kaupp, MD  ASSISTANTS: None  ANESTHESIA:   general  EBL:  Total I/O In: 300 [I.V.:300] Out: 2 [Blood:2]  BLOOD ADMINISTERED:none  DRAINS: none   LOCAL MEDICATIONS USED:  NONE  SPECIMEN:  No Specimen  DISPOSITION OF SPECIMEN:  N/A  COUNTS:  YES  TOURNIQUET:  * Missing tourniquet times found for documented tourniquets in log:  [id number] *  IMPLANTS: None  DICTATION: .Dragon Dictation patient brought the operating room and after adequate anesthesia was obtained, the left arm was prepped and draped in sterile fashion. After patient identification and timeout procedures were completed the tourniquet was raised. A curvilinear incision was made over the distal radius on the volar aspect on the radial side. Subcutaneous skin and subcutaneous tissue was spread and the radial artery was identified proximal to the ganglion. This was adherent to the ganglion and was separated from the ganglion to preserve it during the case. The ganglion was then opened and evacuated and tract down to the radiocarpal joint where an opening was identified and cauterized to try to cause scar tissue and prevent recurrence. The tourniquet was let down at this point and the wound irrigated. There is no arterial bleeding. The wound was then closed with simple interrupted nylon skin sutures and a dressing applied with Xeroform 4 x 4 web roll and volar splint followed by an Ace wrap. Since the ganglion was so adherent to the radial artery the wall the ganglion was not excised to prevent injury  PLAN OF CARE: Discharge to home after PACU  PATIENT DISPOSITION:  PACU - hemodynamically stable.

## 2017-05-22 NOTE — Anesthesia Procedure Notes (Signed)
Procedure Name: LMA Insertion Performed by: Sadira Standard Pre-anesthesia Checklist: Patient identified, Patient being monitored, Timeout performed, Emergency Drugs available and Suction available Patient Re-evaluated:Patient Re-evaluated prior to induction Oxygen Delivery Method: Circle system utilized Preoxygenation: Pre-oxygenation with 100% oxygen Induction Type: IV induction LMA: LMA inserted LMA Size: 4.5 Tube type: Oral Number of attempts: 1 Placement Confirmation: positive ETCO2 and breath sounds checked- equal and bilateral Tube secured with: Tape Dental Injury: Teeth and Oropharynx as per pre-operative assessment        

## 2017-05-22 NOTE — H&P (Signed)
Reviewed paper H+P, will be scanned into chart. No changes noted.  

## 2017-05-22 NOTE — Transfer of Care (Signed)
Immediate Anesthesia Transfer of Care Note  Patient: Alan Dennis  Procedure(s) Performed: Procedure(s): REMOVAL GANGLION OF WRIST (Left)  Patient Location: PACU  Anesthesia Type:General  Level of Consciousness: sedated  Airway & Oxygen Therapy: Patient Spontanous Breathing and Patient connected to face mask oxygen  Post-op Assessment: Report given to RN and Post -op Vital signs reviewed and stable  Post vital signs: Reviewed and stable  Last Vitals:  Vitals:   05/22/17 0936 05/22/17 0937  BP: (!) 95/58 (!) 95/58  Pulse: (!) 50 (!) 48  Resp: 13 13  Temp: (!) 36.1 C   SpO2: 98% 99%    Last Pain:  Vitals:   05/22/17 0824  TempSrc: Oral         Complications: No apparent anesthesia complications

## 2017-05-22 NOTE — Discharge Instructions (Addendum)
Move fingers is much as possible to prevent stiffness. Leave dressing in place. Keep dressing clean and dry. Pain medicine as directed. If fingers swell a great deal loosen Ace wrap and reapply  AMBULATORY SURGERY  DISCHARGE INSTRUCTIONS   1) The drugs that you were given will stay in your system until tomorrow so for the next 24 hours you should not:  A) Drive an automobile B) Make any legal decisions C) Drink any alcoholic beverage   2) You may resume regular meals tomorrow.  Today it is better to start with liquids and gradually work up to solid foods.  You may eat anything you prefer, but it is better to start with liquids, then soup and crackers, and gradually work up to solid foods.   3) Please notify your doctor immediately if you have any unusual bleeding, trouble breathing, redness and pain at the surgery site, drainage, fever, or pain not relieved by medication.    4) Additional Instructions:        Please contact your physician with any problems or Same Day Surgery at [phone], Monday through Friday 6 am to 4 pm, or [place] at [facility] Main number at [phone].

## 2017-05-22 NOTE — Anesthesia Post-op Follow-up Note (Signed)
Anesthesia QCDR form completed.        

## 2017-06-02 NOTE — Anesthesia Postprocedure Evaluation (Signed)
Anesthesia Post Note  Patient: GREYSYN VANDERBERG  Procedure(s) Performed: Procedure(s) (LRB): REMOVAL GANGLION OF WRIST (Left)  Patient location during evaluation: PACU Anesthesia Type: General Level of consciousness: awake and alert Pain management: pain level controlled Vital Signs Assessment: post-procedure vital signs reviewed and stable Respiratory status: spontaneous breathing, nonlabored ventilation, respiratory function stable and patient connected to nasal cannula oxygen Cardiovascular status: blood pressure returned to baseline and stable Postop Assessment: no apparent nausea or vomiting Anesthetic complications: no     Last Vitals:  Vitals:   05/22/17 1019 05/22/17 1037  BP: (!) 170/74 (!) 164/88  Pulse: 68 60  Resp: 16 16  Temp: (!) 36.2 C   SpO2: 100%     Last Pain:  Vitals:   05/23/17 0849  TempSrc:   PainSc: 0-No pain                 Yevette Edwards

## 2017-06-02 NOTE — H&P (Signed)
Marlena Clipper, MD - 05/12/2017 10:00 AM EDT Formatting of this note may be different from the original.  Chief Complaint  Patient presents with  . Wrist Pain  Ganglion Cyst.   History of the Present Illness: Alan Dennis is a 76 y.o. male here for a left wrist mass. The patient reports he had a significant green stick fracture in the past. He notes that his left wrist mass has been present for approximately a month. He adds that the mass is painful and pain radiates up the arm on occasion.  I have reviewed past medical, surgical, social and family history, and allergies as documented in the EMR.  Past Medical History: Past Medical History:  Diagnosis Date  . AA (aortic aneurysm) (CMS-HCC)  . Allergic state  . Arm fracture, left  . Arthritis  . Gout  . Hypertension   Past Surgical History: Past Surgical History:  Procedure Laterality Date  . COLONOSCOPY 10/30/2007  . KNEE ARTHROSCOPY Right 12/31/1999  Dr. Monica Martinez  . Lumbar disc surgery  . Right shoulder scope   Past Family History: Family History  Problem Relation Age of Onset  . Diabetes type II Mother  . Other Father  Brain tumor  . Diabetes type II Sister  . Diabetes type II Brother  . Allergies Other  . High blood pressure (Hypertension) Other   Medications: Current Outpatient Prescriptions Ordered in Epic  Medication Sig Dispense Refill  . allopurinol (ZYLOPRIM) 100 MG tablet take 2 tablets by mouth once daily 60 tablet 5  . aspirin 81 MG EC tablet Take 81 mg by mouth 2 (two) times daily.  Marland Kitchen esomeprazole (NEXIUM) 40 MG DR capsule Take 40 mg by mouth once daily.  Marland Kitchen LACTOBAC CMB #3/FOS/PANTETHINE (PROBIOTIC & ACIDOPHILUS ORAL) Take by mouth once daily.  Marland Kitchen latanoprost (XALATAN) 0.005 % ophthalmic solution Place 1 drop into both eyes nightly.  Marland Kitchen losartan (COZAAR) 100 MG tablet take 1 tablet by mouth once daily 90 tablet 1   No current Epic-ordered facility-administered medications on file.    Allergies: Allergies  Allergen Reactions  . Lisinopril Muscle Pain  . Sulfa (Sulfonamide Antibiotics) Rash  . Tetracycline Unknown    Body mass index is 29.56 kg/m.  Review of Systems: A comprehensive 14 point ROS was performed, reviewed, and the pertinent orthopaedic findings are documented in the HPI.  Vitals:  05/12/17 0950  BP: 126/80   General Physical Examination:  General/Constitutional: No apparent distress: well-nourished and well developed. Eyes: Pupils equal, round with synchronous movement. Lungs: Clear to auscultation HEENT: Normal Vascular: No edema, swelling or tenderness, except as noted in detailed exam. Cardiac: Heart rate and rhythm is regular. Integumentary: No impressive skin lesions present, except as noted in detailed exam. Neuro/Psych: Normal mood and affect, oriented to person, place and time.  Musculoskeletal Examination: On exam, the patient has a ganglion cyst on the front that is firm. It is approximately 1.5 cm wide and over the radial artery. On Allen test he has about equal ulnar artery and radial artery.   Lungs are clear. Heart rate and rhythm is normal. HEENT is normal.  Radiographs: AP, lateral, and oblique x-rays of the left wrist were ordered and personally reviewed today. These show severe radiocarpal arthritis with erosion of the scaphoid into the distal radius with significant bone loss on the radial scaphoid and radial lunate joints. There is no ulnar styloid fracture. There is disruption of the lunocapitate joint as well as significant intercarpal arthritis and thumb CMC arthritis.  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 Left wrist pain M25.532 719.43  2. Ganglion of right wrist M67.431 727.41   Plan: The patient has clinical findings of a painful ganglion cyst of the left wrist. He also has significant wrist post-traumatic arthritis.   I reviewed the  patient's x-rays with him today. We discussed his condition and treatment options. I recommend he have excision of the ganglion cyst on an outpatient status. The procedure and expected outcome was discussed in detail today. I also spoke with him about the higher risk of recurrence with the extent of his wrist post-traumatic arthritis.   Surgical Risks: The nature of the condition and the proposed procedure has been reviewed in detail with the patient. Surgical versus non-surgical options and prognosis for recovery have been reviewed and the inherent risks and benefits of each have been discussed including the risks of infection, bleeding, injury to nerves / blood vessels / tendons, incomplete relief of symptoms, persisting pain and / or stiffness, loss of function, complex regional pain syndrome, failure of procedure, as appropriate.  Scribe Attestation: I, Georgia Lopes, am acting as scribe for [employer], MD

## 2018-04-19 ENCOUNTER — Other Ambulatory Visit: Payer: Self-pay | Admitting: Gastroenterology

## 2018-10-22 ENCOUNTER — Other Ambulatory Visit: Payer: Self-pay | Admitting: Neurosurgery

## 2018-10-22 DIAGNOSIS — M5416 Radiculopathy, lumbar region: Secondary | ICD-10-CM

## 2018-10-31 ENCOUNTER — Ambulatory Visit
Admission: RE | Admit: 2018-10-31 | Discharge: 2018-10-31 | Disposition: A | Discharge status: Home / Self Care | Payer: Medicare Other | Source: Ambulatory Visit | Attending: Neurosurgery | Admitting: Neurosurgery

## 2018-10-31 DIAGNOSIS — M5416 Radiculopathy, lumbar region: Secondary | ICD-10-CM | POA: Diagnosis present

## 2018-10-31 MED ORDER — GADOBUTROL 1 MMOL/ML IV SOLN
9.0000 mL | Freq: Once | INTRAVENOUS | Status: AC | PRN
  Administered 2018-10-31: 9 mL via INTRAVENOUS

## 2020-03-03 ENCOUNTER — Other Ambulatory Visit: Payer: Self-pay | Admitting: Internal Medicine

## 2020-03-03 DIAGNOSIS — I10 Essential (primary) hypertension: Secondary | ICD-10-CM

## 2020-03-03 DIAGNOSIS — I714 Abdominal aortic aneurysm, without rupture, unspecified: Secondary | ICD-10-CM

## 2020-03-08 ENCOUNTER — Ambulatory Visit
Admission: RE | Admit: 2020-03-08 | Discharge: 2020-03-08 | Disposition: A | Discharge status: Home / Self Care | Payer: Medicare PPO | Source: Ambulatory Visit | Attending: Internal Medicine | Admitting: Internal Medicine

## 2020-03-08 ENCOUNTER — Other Ambulatory Visit: Payer: Self-pay

## 2020-03-08 DIAGNOSIS — I714 Abdominal aortic aneurysm, without rupture, unspecified: Secondary | ICD-10-CM

## 2020-03-08 DIAGNOSIS — I10 Essential (primary) hypertension: Secondary | ICD-10-CM | POA: Insufficient documentation

## 2020-03-08 MED ORDER — IOHEXOL 350 MG/ML SOLN
100.0000 mL | Freq: Once | INTRAVENOUS | Status: AC | PRN
  Administered 2020-03-08: 08:00:00 100 mL via INTRAVENOUS

## 2020-09-16 DIAGNOSIS — U071 COVID-19: Secondary | ICD-10-CM

## 2020-09-16 DIAGNOSIS — R55 Syncope and collapse: Secondary | ICD-10-CM

## 2020-09-16 HISTORY — DX: Syncope and collapse: R55

## 2020-09-16 HISTORY — DX: COVID-19: U07.1

## 2020-10-23 ENCOUNTER — Other Ambulatory Visit: Payer: Self-pay | Admitting: Internal Medicine

## 2020-10-23 DIAGNOSIS — Z8739 Personal history of other diseases of the musculoskeletal system and connective tissue: Secondary | ICD-10-CM | POA: Insufficient documentation

## 2020-10-23 DIAGNOSIS — R29898 Other symptoms and signs involving the musculoskeletal system: Secondary | ICD-10-CM

## 2020-10-23 DIAGNOSIS — I10 Essential (primary) hypertension: Secondary | ICD-10-CM

## 2020-10-26 ENCOUNTER — Ambulatory Visit
Admission: RE | Admit: 2020-10-26 | Discharge: 2020-10-26 | Disposition: A | Discharge status: Home / Self Care | Payer: Medicare PPO | Source: Ambulatory Visit | Attending: Internal Medicine | Admitting: Internal Medicine

## 2020-10-26 ENCOUNTER — Other Ambulatory Visit: Payer: Self-pay

## 2020-10-26 DIAGNOSIS — R29898 Other symptoms and signs involving the musculoskeletal system: Secondary | ICD-10-CM | POA: Diagnosis present

## 2020-10-26 DIAGNOSIS — I10 Essential (primary) hypertension: Secondary | ICD-10-CM | POA: Insufficient documentation

## 2020-11-03 ENCOUNTER — Emergency Department: Payer: Medicare PPO

## 2020-11-03 ENCOUNTER — Other Ambulatory Visit: Payer: Self-pay

## 2020-11-03 ENCOUNTER — Observation Stay
Admission: EM | Admit: 2020-11-03 | Discharge: 2020-11-04 | Disposition: A | Discharge status: Home / Self Care | Payer: Medicare PPO | Attending: Family Medicine | Admitting: Family Medicine

## 2020-11-03 ENCOUNTER — Encounter: Payer: Self-pay | Admitting: Emergency Medicine

## 2020-11-03 ENCOUNTER — Observation Stay: Payer: Medicare PPO

## 2020-11-03 DIAGNOSIS — R008 Other abnormalities of heart beat: Secondary | ICD-10-CM

## 2020-11-03 DIAGNOSIS — K219 Gastro-esophageal reflux disease without esophagitis: Secondary | ICD-10-CM

## 2020-11-03 DIAGNOSIS — Z79899 Other long term (current) drug therapy: Secondary | ICD-10-CM | POA: Insufficient documentation

## 2020-11-03 DIAGNOSIS — Z20822 Contact with and (suspected) exposure to covid-19: Secondary | ICD-10-CM | POA: Insufficient documentation

## 2020-11-03 DIAGNOSIS — Z7982 Long term (current) use of aspirin: Secondary | ICD-10-CM | POA: Diagnosis not present

## 2020-11-03 DIAGNOSIS — R42 Dizziness and giddiness: Secondary | ICD-10-CM | POA: Diagnosis present

## 2020-11-03 DIAGNOSIS — I6529 Occlusion and stenosis of unspecified carotid artery: Secondary | ICD-10-CM

## 2020-11-03 DIAGNOSIS — Z87891 Personal history of nicotine dependence: Secondary | ICD-10-CM | POA: Diagnosis not present

## 2020-11-03 DIAGNOSIS — I10 Essential (primary) hypertension: Secondary | ICD-10-CM

## 2020-11-03 DIAGNOSIS — I6523 Occlusion and stenosis of bilateral carotid arteries: Secondary | ICD-10-CM

## 2020-11-03 DIAGNOSIS — Z8673 Personal history of transient ischemic attack (TIA), and cerebral infarction without residual deficits: Secondary | ICD-10-CM | POA: Insufficient documentation

## 2020-11-03 DIAGNOSIS — I779 Disorder of arteries and arterioles, unspecified: Secondary | ICD-10-CM | POA: Diagnosis present

## 2020-11-03 DIAGNOSIS — R55 Syncope and collapse: Principal | ICD-10-CM | POA: Insufficient documentation

## 2020-11-03 DIAGNOSIS — R000 Tachycardia, unspecified: Secondary | ICD-10-CM

## 2020-11-03 LAB — URINALYSIS, COMPLETE (UACMP) WITH MICROSCOPIC
Bacteria, UA: NONE SEEN
Bilirubin Urine: NEGATIVE
Glucose, UA: NEGATIVE mg/dL
Hgb urine dipstick: NEGATIVE
Ketones, ur: NEGATIVE mg/dL
Leukocytes,Ua: NEGATIVE
Nitrite: NEGATIVE
Protein, ur: NEGATIVE mg/dL
Specific Gravity, Urine: 1.009 (ref 1.005–1.030)
Squamous Epithelial / LPF: NONE SEEN (ref 0–5)
pH: 6 (ref 5.0–8.0)

## 2020-11-03 LAB — BASIC METABOLIC PANEL
Anion gap: 11 (ref 5–15)
BUN: 19 mg/dL (ref 8–23)
CO2: 23 mmol/L (ref 22–32)
Calcium: 9.7 mg/dL (ref 8.9–10.3)
Chloride: 104 mmol/L (ref 98–111)
Creatinine, Ser: 1.04 mg/dL (ref 0.61–1.24)
GFR, Estimated: >60 mL/min (ref >60)
Glucose, Bld: 138 mg/dL — ABNORMAL HIGH (ref 70–99)
Potassium: 3.9 mmol/L (ref 3.5–5.1)
Sodium: 138 mmol/L (ref 135–145)

## 2020-11-03 LAB — CBC
HCT: 44 % (ref 39.0–52.0)
Hemoglobin: 14.5 g/dL (ref 13.0–17.0)
MCH: 32.1 pg (ref 26.0–34.0)
MCHC: 33 g/dL (ref 30.0–36.0)
MCV: 97.3 fL (ref 80.0–100.0)
Platelets: 144 10*3/uL — ABNORMAL LOW (ref 150–400)
RBC: 4.52 MIL/uL (ref 4.22–5.81)
RDW: 12.7 % (ref 11.5–15.5)
WBC: 7.1 10*3/uL (ref 4.0–10.5)
nRBC: 0 % (ref 0.0–0.2)

## 2020-11-03 LAB — TROPONIN I (HIGH SENSITIVITY)
Troponin I (High Sensitivity): 6 ng/L (ref <18)
Troponin I (High Sensitivity): 8 ng/L (ref <18)

## 2020-11-03 LAB — MAGNESIUM: Magnesium: 2.1 mg/dL (ref 1.7–2.4)

## 2020-11-03 LAB — SARS CORONAVIRUS 2 (TAT 6-24 HRS): SARS Coronavirus 2: NEGATIVE

## 2020-11-03 LAB — GLUCOSE, CAPILLARY: Glucose-Capillary: 135 mg/dL — ABNORMAL HIGH (ref 70–99)

## 2020-11-03 MED ORDER — SODIUM CHLORIDE 0.9% FLUSH
3.0000 mL | Freq: Two times a day (BID) | INTRAVENOUS | Status: DC
  Administered 2020-11-03 – 2020-11-04 (×2): 3 mL via INTRAVENOUS

## 2020-11-03 MED ORDER — LATANOPROST 0.005 % OP SOLN
1.0000 [drp] | Freq: Every day | OPHTHALMIC | Status: DC
  Administered 2020-11-03: 21:00:00 1 [drp] via OPHTHALMIC
  Filled 2020-11-03: qty 2.5

## 2020-11-03 MED ORDER — LOSARTAN POTASSIUM 50 MG PO TABS
100.0000 mg | ORAL_TABLET | Freq: Every day | ORAL | Status: DC
  Administered 2020-11-04: 09:00:00 100 mg via ORAL
  Filled 2020-11-03 (×2): qty 2

## 2020-11-03 MED ORDER — ACETAMINOPHEN 500 MG PO TABS: 1000.0000 mg | ORAL_TABLET | Freq: Three times a day (TID) | ORAL | Status: DC | PRN

## 2020-11-03 MED ORDER — SODIUM CHLORIDE 0.9% FLUSH
3.0000 mL | Freq: Two times a day (BID) | INTRAVENOUS | Status: DC
  Administered 2020-11-03 (×2): 3 mL via INTRAVENOUS

## 2020-11-03 MED ORDER — ONDANSETRON HCL 4 MG PO TABS: 4.0000 mg | ORAL_TABLET | Freq: Four times a day (QID) | ORAL | Status: DC | PRN

## 2020-11-03 MED ORDER — SODIUM CHLORIDE 0.9 % IV SOLN: 250.0000 mL | INTRAVENOUS | Status: DC | PRN

## 2020-11-03 MED ORDER — ASPIRIN EC 81 MG PO TBEC
162.0000 mg | DELAYED_RELEASE_TABLET | Freq: Every day | ORAL | Status: DC
  Administered 2020-11-04: 09:00:00 162 mg via ORAL
  Filled 2020-11-03 (×2): qty 2

## 2020-11-03 MED ORDER — ONDANSETRON HCL 4 MG/2ML IJ SOLN: 4.0000 mg | Freq: Four times a day (QID) | INTRAMUSCULAR | Status: DC | PRN

## 2020-11-03 MED ORDER — TRIAMCINOLONE ACETONIDE 55 MCG/ACT NA AERO
1.0000 | INHALATION_SPRAY | Freq: Every day | NASAL | Status: DC
  Filled 2020-11-03: qty 10.8

## 2020-11-03 MED ORDER — HYDROCORTISONE 1 % EX CREA
1.0000 "application " | TOPICAL_CREAM | Freq: Two times a day (BID) | CUTANEOUS | Status: DC
  Filled 2020-11-03: qty 28

## 2020-11-03 MED ORDER — SODIUM CHLORIDE 0.9% FLUSH: 3.0000 mL | INTRAVENOUS | Status: DC | PRN

## 2020-11-03 MED ORDER — PANTOPRAZOLE SODIUM 40 MG PO TBEC
40.0000 mg | DELAYED_RELEASE_TABLET | Freq: Every day | ORAL | Status: DC
  Administered 2020-11-04: 40 mg via ORAL
  Filled 2020-11-03 (×2): qty 1

## 2020-11-03 MED ORDER — ALLOPURINOL 100 MG PO TABS
200.0000 mg | ORAL_TABLET | Freq: Every day | ORAL | Status: DC
  Administered 2020-11-04: 09:00:00 200 mg via ORAL
  Filled 2020-11-03 (×2): qty 2

## 2020-11-03 MED ORDER — PROBIOTIC-10 PO CAPS: 1.0000 | ORAL_CAPSULE | Freq: Every day | ORAL | Status: DC

## 2020-11-03 MED ORDER — ENOXAPARIN SODIUM 40 MG/0.4ML ~~LOC~~ SOLN
40.0000 mg | SUBCUTANEOUS | Status: DC
  Administered 2020-11-03: 21:00:00 40 mg via SUBCUTANEOUS
  Filled 2020-11-03: qty 0.4

## 2020-11-03 MED ORDER — IOHEXOL 350 MG/ML SOLN
75.0000 mL | Freq: Once | INTRAVENOUS | Status: AC | PRN
  Administered 2020-11-03: 20:00:00 75 mL via INTRAVENOUS

## 2020-11-03 NOTE — ED Notes (Signed)
Pt with noted frequent PVC's while laying down, while patient standing, pt noted to go into a ventricular trigemeny. Pt to the bathroom at this time ambulatory independently at this time.

## 2020-11-03 NOTE — H&P (Signed)
History and Physical    Alan Dennis BLT:[id number] DOB: 1941-07-06 DOA: 11/03/2020  PCP: Barbette Reichmann, MD   Patient coming from: Home  I have personally briefly reviewed patient's old medical records in [facility]  Chief Complaint: " I almost passed out"  HPI: Alan Dennis is a 80 y.o. male with medical history significant for carotid artery disease, hypertension, CVA, GERD who presents to the ER by private vehicle after he had 2 episodes of near syncope today.  Patient states that he had his first episode at about 730 this morning while waiting for his friend for breakfast.  He was seated when he suddenly felt very dizzy and lightheaded like he was going to pass out.  He denied having any nausea, vomiting, palpitations prior to that episode.  It did resolve and he was back to his baseline.  He had a second episode where he was sitting at his computer and felt very dizzy and lightheaded.  He also had a sudden onset flashing light.  The second episode was associated with feeling like he had to use the bathroom which he did and had a normal bowel movement.  He was not constipated and did not have to strain.  After the second episode he felt he had a panic attack and was so scared that something bad was about to happen.  He decided to come to the emergency room to get checked out. He denied having any chest pain, no shortness of breath, no palpitations, no diaphoresis, no headache, no blurred vision, no nausea, no vomiting, no abdominal pain urinary frequency, no nocturia, no dysuria, no fever, no cough. Labs showed sodium 138, potassium 3, chloride 104, bicarb, creatinine 1.04, calcium 9, troponin VIII, white count 7.1, hemoglobin 14.5, hematocrit 44, MCV 97, RDW 12.7, platelet count 144 Patient SARS coronavirus 2 PCR test is pending CT scan of the head without contrast shows no acute finding. 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 Twelve-lead EKG reviewed by me shows  normal sinus rhythm with nonspecific ST abnormality.   ED Course: Patient is an 80 year old Caucasian male who presents to the ER for evaluation of near syncope which occurred at rest.  He was noted to have PVCs on the monitor as well as trigeminy.  Patient also has a systolic murmur.  He will be referred to observation status for further evaluation.   Review of Systems: As per HPI otherwise all other systems reviewed and negative.    Past Medical History:  Diagnosis Date  . Arthritis   . GERD (gastroesophageal reflux disease)   . Gout   . History of hiatal hernia    2018  . Hypertension   . Stroke [facility]) 2015   TIA - no deficits    Past Surgical History:  Procedure Laterality Date  . ABDOMINAL AORTIC ANEURYSM REPAIR  2005  . BACK SURGERY     spur removal, (also, major back surg in 1970s)  . ESOPHAGOGASTRODUODENOSCOPY (EGD) WITH PROPOFOL N/A 05/08/2017   Procedure: ESOPHAGOGASTRODUODENOSCOPY (EGD) WITH PROPOFOL;  Surgeon: Toney Reil, MD;  Location: [facility] ENDOSCOPY;  Service: Gastroenterology;  Laterality: N/A;  . EYE SURGERY     bilateral  . GANGLION CYST EXCISION Left 05/22/2017   Procedure: REMOVAL GANGLION OF WRIST;  Surgeon: Kennedy Bucker, MD;  Location: [facility] ORS;  Service: Orthopedics;  Laterality: Left;  . IMAGE GUIDED SINUS SURGERY N/A 02/20/2017   Procedure: IMAGE GUIDED SINUS SURGERY;  Surgeon: Vernie Murders, MD;  Location: [facility];  Service: ENT;  Laterality: N/A;  need disk GAVE DISK TO CE[facility] 5-15 KP  . KNEE SURGERY Right   . NASAL SINUS SURGERY     several  . SHOULDER SURGERY Right   . SINUSOTOMY Right 02/20/2017   Procedure: SINUSOTOMY right endoscopic with removal content;  Surgeon: Vernie MurdersJuengel, Paul, MD;  Location[facility];  Service: ENT;  Laterality: Right;     reports that he quit smoking about 37 years ago. He has never used smokeless tobacco. He reports current alcohol use of about 14.0 standard drinks of alcohol per week. He reports  that he does not use drugs.  Allergies  Allergen Reactions  . Bactrim [Sulfamethoxazole-Trimethoprim] Hives  . Tetracycline Rash  . Lisinopril Other (See Comments)    Leg muscles ache  . Sulfa Antibiotics Hives and Rash  . Tetracyclines & Related Rash  . Zithromax [Azithromycin] Hives    Cough/ unsure    History reviewed. No pertinent family history.    Prior to Admission medications   Medication Sig Start Date End Date Taking? Authorizing Provider  acetaminophen (TYLENOL) 500 MG tablet Take 1,000 mg by mouth every 8 (eight) hours as needed for mild pain or moderate pain.    [provider]  allopurinol (ZYLOPRIM) 100 MG tablet Take 100-200 mg by mouth See admin instructions. Alternates and takes 100 mg 1 day and 200 mg the next    [provider]  aspirin EC 81 MG tablet Take 162 mg by mouth daily.     [provider]  esomeprazole (NEXIUM) 40 MG capsule Take 1 capsule (40 mg total) by mouth daily before breakfast. Take 30min before breakfast 05/06/17 06/05/17  Toney ReilVanga, Rohini Reddy, MD  hydrocortisone cream 1 % Apply 1 application topically 2 (two) times daily.    [provider]  latanoprost (XALATAN) 0.005 % ophthalmic solution Place 1 drop into both eyes at bedtime.     [provider]  losartan (COZAAR) 100 MG tablet Take 100 mg by mouth daily.    [provider]  Probiotic Product (PROBIOTIC-10) CAPS Take 1 tablet by mouth daily.     [provider]  Triamcinolone Acetonide (NASACORT ALLERGY 24HR NA) Place 1 spray into the nose daily.    [provider]    Physical Exam: Vitals:   11/03/20 1045 11/03/20 1056 11/03/20 1201 11/03/20 1216  BP:  (!) 175/96 (!) 166/94   Pulse:  (!) 101 89 (!) 105  Resp:  17 19 14   Temp:  98.6 F (37 C)    TempSrc:  Oral    SpO2:  98% 96% 97%  Weight: 95.7 kg     Height: 5\' 10"  (1.778 m)        Vitals:   11/03/20 1045 11/03/20 1056 11/03/20 1201 11/03/20 1216  BP:  (!)  175/96 (!) 166/94   Pulse:  (!) 101 89 (!) 105  Resp:  17 19 14   Temp:  98.6 F (37 C)    TempSrc:  Oral    SpO2:  98% 96% 97%  Weight: 95.7 kg     Height: 5\' 10"  (1.778 m)         Constitutional: Alert and oriented x 3 . Not in any apparent distress.  HEENT:      Head: Normocephalic and atraumatic.         Eyes: PERLA, EOMI, Conjunctivae are normal. Sclera is non-icteric.       Mouth/Throat: Mucous membranes are moist.  Neck: Supple with no signs of meningismus. Cardiovascular: Regular rate and rhythm. No murmurs, gallops, or rubs. 2+ symmetrical distal pulses are present . No JVD. No LE edema, + systolic murmur Respiratory: Respiratory effort normal .Lungs sounds clear bilaterally. No wheezes, crackles, or rhonchi.  Gastrointestinal: Soft, non tender, and non distended with positive bowel sounds.  Genitourinary: No CVA tenderness. Musculoskeletal: Nontender with normal range of motion in all extremities. No cyanosis, or erythema of extremities. Neurologic:  Face is symmetric. Moving all extremities. No gross focal neurologic deficits  Skin: Skin is warm, dry.  No rash or ulcers Psychiatric: Mood and affect are normal   Labs on Admission: I have personally reviewed following labs and imaging studies  CBC: Recent Labs  Lab 11/03/20 1055  WBC 7.1  HGB 14.5  HCT 44.0  MCV 97.3  PLT 144*   Basic Metabolic Panel: Recent Labs  Lab 11/03/20 1055  NA 138  K 3.9  CL 104  CO2 23  GLUCOSE 138*  BUN 19  CREATININE 1.04  CALCIUM 9.7   GFR: Estimated Creatinine Clearance: 64.7 mL/min (by C-G formula based on SCr of 1.04 mg/dL). Liver Function Tests: No results for input(s): AST, ALT, ALKPHOS, BILITOT, PROT, ALBUMIN in the last 168 hours. No results for input(s): LIPASE, AMYLASE in the last 168 hours. No results for input(s): AMMONIA in the last 168 hours. Coagulation Profile: No results for input(s): INR, PROTIME in the last 168 hours. Cardiac Enzymes: No  results for input(s): CKTOTAL, CKMB, CKMBINDEX, TROPONINI in the last 168 hours. BNP (last 3 results) No results for input(s): PROBNP in the last 8760 hours. HbA1C: No results for input(s): HGBA1C in the last 72 hours. CBG: No results for input(s): GLUCAP in the last 168 hours. Lipid Profile: No results for input(s): CHOL, HDL, LDLCALC, TRIG, CHOLHDL, LDLDIRECT in the last 72 hours. Thyroid Function Tests: No results for input(s): TSH, T4TOTAL, FREET4, T3FREE, THYROIDAB in the last 72 hours. Anemia Panel: No results for input(s): VITAMINB12, FOLATE, FERRITIN, TIBC, IRON, RETICCTPCT in the last 72 hours. Urine analysis:    Component Value Date/Time   COLORURINE STRAW (A) 11/03/2020 1213   APPEARANCEUR CLEAR (A) 11/03/2020 1213   LABSPEC 1.009 11/03/2020 1213   PHURINE 6.0 11/03/2020 1213   GLUCOSEU NEGATIVE 11/03/2020 1213   HGBUR NEGATIVE 11/03/2020 1213   BILIRUBINUR NEGATIVE 11/03/2020 1213   KETONESUR NEGATIVE 11/03/2020 1213   PROTEINUR NEGATIVE 11/03/2020 1213   NITRITE NEGATIVE 11/03/2020 1213   LEUKOCYTESUR NEGATIVE 11/03/2020 1213    Radiological Exams on Admission: CT Head Wo Contrast  Result Date: 11/03/2020 CLINICAL DATA:  Neuro deficit with acute stroke suspected. Weakness and near syncope EXAM: CT HEAD WITHOUT CONTRAST TECHNIQUE: Contiguous axial images were obtained from the base of the skull through the vertex without intravenous contrast. COMPARISON:  Brain MRI 04/18/2014 FINDINGS: Brain: No evidence of acute infarction, hemorrhage, hydrocephalus, extra-axial collection or mass lesion/mass effect. 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 Vascular: No hyperdense vessel or unexpected calcification. Skull: Normal. Negative for fracture or focal lesion. Sinuses/Orbits: Bilateral cataract resection. IMPRESSION: 1. No acute finding. 2. 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 Electronically Signed   By: Marnee Spring M.D.   On: 11/03/2020 11:18      Assessment/Plan Principal Problem:   Near syncope Active Problems:   Carotid artery disease (HCC)   Essential hypertension   Gastroesophageal reflux disease    Near syncope Unclear etiology Orthostatic blood pressure checks Obtained with echocardiogram to assess LVEF and  rule out aortic stenosis Cardiac monitoring to rule out arrhythmias Request cardiology consult    Carotid artery disease Patient had carotid Doppler which showed Right ICA stenosis estimated at 50-69%. Left ICA narrowing less than 50%. Patent antegrade vertebral flow bilaterally Continue aspirin We will consult vascular surgery    Hypertension Continue losartan    GERD Continue Protonix     DVT prophylaxis: Lovenox Code Status: full code Family Communication: Greater than 50% of time was spent discussing patient's condition and plan of care with him and his wife at the bedside.  They verbalized understanding and agreed with the treatment plan. Disposition Plan: Back to previous home environment Consults called: Cardiology Status: Observation    Loukisha Gunnerson MD [facility]     11/03/2020, 2:05 PM

## 2020-11-03 NOTE — Plan of Care (Signed)
  Problem: Education: Goal: Knowledge of General Education information will improve Description: Including pain rating scale, medication(s)/side effects and non-pharmacologic comfort measures Outcome: Progressing   Problem: Health Behavior/Discharge Planning: Goal: Ability to manage health-related needs will improve Outcome: Progressing   Problem: Clinical Measurements: Goal: Ability to maintain clinical measurements within normal limits will improve Outcome: Progressing Goal: Will remain free from infection Outcome: Progressing Goal: Diagnostic test results will improve Outcome: Progressing Goal: Respiratory complications will improve Outcome: Progressing Goal: Cardiovascular complication will be avoided Outcome: Progressing   Problem: Nutrition: Goal: Adequate nutrition will be maintained Outcome: Progressing   Problem: Activity: Goal: Risk for activity intolerance will decrease Outcome: Progressing   Problem: Coping: Goal: Level of anxiety will decrease Outcome: Progressing   Problem: Elimination: Goal: Will not experience complications related to bowel motility Outcome: Progressing Goal: Will not experience complications related to urinary retention Outcome: Progressing   Problem: Pain Managment: Goal: General experience of comfort will improve Outcome: Progressing   Problem: Nutrition: Goal: Adequate nutrition will be maintained Outcome: Progressing   Problem: Coping: Goal: Level of anxiety will decrease Outcome: Progressing   Problem: Pain Managment: Goal: General experience of comfort will improve Outcome: Progressing   Problem: Safety: Goal: Ability to remain free from injury will improve Outcome: Progressing

## 2020-11-03 NOTE — ED Provider Notes (Addendum)
[facility] Emergency Department Provider Note  ____________________________________________   Event Date/Time   First MD Initiated Contact with Patient 11/03/20 1203     (approximate)  I have reviewed the triage vital signs and the nursing notes.   HISTORY  Chief Complaint Weakness and Near Syncope    HPI Alan Dennis is a 80 y.o. male  With h/o HTN, TIA, GERD, here with near syncopal episode. Pt states he was at breakfast this AM, sitting down, when he began to feel suddenly lightheaded and like he was going to pass out. He "saw spots" in his vision and felt like he had stood up too quickly. This lasted several seconds then resolved. He felt tired afterwards but sx otherwise went away. He ate breakfast, returned home, and did some light work around the house. He went back inside and was sitting down again when he experienced a similar episode, again lasting no more than 1 minute. Reports he feels somewhat lightheaded but otherwise denies complaints. Denies any focal numbness, weakness. No current CP, SOB. No other complaints. No recent med changes.       Past Medical History:  Diagnosis Date  . Arthritis   . GERD (gastroesophageal reflux disease)   . Gout   . History of hiatal hernia    2018  . Hypertension   . Stroke [facility]) 2015   TIA - no deficits    Patient Active Problem List   Diagnosis Date Noted  . Gastroesophageal reflux disease   . AAA (abdominal aortic aneurysm) (HCC) 10/14/2014  . Essential hypertension 10/14/2014  . Low vitamin D level 10/14/2014  . History of stroke 08/03/2014  . Back pain 04/29/2014  . Pain of left lower extremity 04/29/2014  . Sciatica 04/29/2014  . Stroke, recent, without late effect 04/29/2014  . Carotid artery disease (HCC) 04/07/2014    Past Surgical History:  Procedure Laterality Date  . ABDOMINAL AORTIC ANEURYSM REPAIR  2005  . BACK SURGERY     spur removal, (also, major back surg in 1970s)   . ESOPHAGOGASTRODUODENOSCOPY (EGD) WITH PROPOFOL N/A 05/08/2017   Procedure: ESOPHAGOGASTRODUODENOSCOPY (EGD) WITH PROPOFOL;  Surgeon: Toney Reil, MD;  Location: [facility] ENDOSCOPY;  Service: Gastroenterology;  Laterality: N/A;  . EYE SURGERY     bilateral  . GANGLION CYST EXCISION Left 05/22/2017   Procedure: REMOVAL GANGLION OF WRIST;  Surgeon: Kennedy Bucker, MD;  Location: [facility] ORS;  Service: Orthopedics;  Laterality: Left;  . IMAGE GUIDED SINUS SURGERY N/A 02/20/2017   Procedure: IMAGE GUIDED SINUS SURGERY;  Surgeon: Vernie Murders, MD;  Location: [facility] SURGERY CNTR;  Service: ENT;  Laterality: N/A;  need disk GAVE DISK TO CECE 5-15 KP  . KNEE SURGERY Right   . NASAL SINUS SURGERY     several  . SHOULDER SURGERY Right   . SINUSOTOMY Right 02/20/2017   Procedure: SINUSOTOMY right endoscopic with removal content;  Surgeon: Vernie Murders, MD;  Location: [facility] SURGERY [facility]TR;  Service: ENT;  Laterality: Right;    Prior to Admission medications   Medication Sig Start Date End Date Taking? Authorizing Provider  acetaminophen (TYLENOL) 500 MG tablet Take 1,000 mg by mouth every 8 (eight) hours as needed for mild pain or moderate pain.    [provider]  allopurinol (ZYLOPRIM) 100 MG tablet Take 100-200 mg by mouth See admin instructions. Alternates and takes 100 mg 1 day and 200 mg the next    [provider]  aspirin EC 81 MG tablet Take  162 mg by mouth daily.     [provider]  esomeprazole (NEXIUM) 40 MG capsule Take 1 capsule (40 mg total) by mouth daily before breakfast. Take before breakfast 05/06/17 06/05/17  Toney Reil, MD  HYDROcodone-acetaminophen (NORCO) 5-325 MG tablet Take 1 tablet by mouth every 6 (six) hours as needed for moderate pain. 05/22/17   Kennedy Bucker, MD  hydrocortisone cream 1 % Apply 1 application topically 2 (two) times daily.    [provider]  latanoprost (XALATAN) 0.005 % ophthalmic solution Place 1 drop into both  eyes at bedtime.     [provider]  losartan (COZAAR) 100 MG tablet Take 100 mg by mouth daily.    [provider]  Probiotic Product (PROBIOTIC-10) CAPS Take 1 tablet by mouth daily.     [provider]  Triamcinolone Acetonide (NASACORT ALLERGY 24HR NA) Place 1 spray into the nose daily.    [provider]    Allergies Bactrim [sulfamethoxazole-trimethoprim], Tetracycline, Lisinopril, Sulfa antibiotics, Tetracyclines & related, and Zithromax [azithromycin]  History reviewed. No pertinent family history.  Social History Social History   Tobacco Use  . Smoking status: Former Smoker    Quit date: 1985    Years since quitting: 37.1  . Smokeless tobacco: Never Used  Vaping Use  . Vaping Use: Never used  Substance Use Topics  . Alcohol use: Yes    Alcohol/week: 14.0 standard drinks    Types: 14 Glasses of wine per week  . Drug use: No    Review of Systems  Review of Systems  Constitutional: Positive for fatigue. Negative for chills and fever.  HENT: Negative for sore throat.   Respiratory: Negative for shortness of breath.   Cardiovascular: Negative for chest pain.  Gastrointestinal: Negative for abdominal pain.  Genitourinary: Negative for flank pain.  Musculoskeletal: Negative for neck pain.  Skin: Negative for rash and wound.  Allergic/Immunologic: Negative for immunocompromised state.  Neurological: Positive for weakness and light-headedness. Negative for numbness.  Hematological: Does not bruise/bleed easily.  All other systems reviewed and are negative.    ____________________________________________  PHYSICAL EXAM:      VITAL SIGNS: ED Triage Vitals  Enc Vitals Group     BP 11/03/20 1056 (!) 175/96     Pulse Rate 11/03/20 1056 (!) 101     Resp 11/03/20 1056 17     Temp 11/03/20 1056 98.6 F (37 C)     Temp Source 11/03/20 1056 Oral     SpO2 11/03/20 1056 98 %     Weight 11/03/20 1045 211 lb (95.7 kg)     Height  11/03/20 1045 5\' 10"  (1.778 m)     Head Circumference --      Peak Flow --      Pain Score 11/03/20 1045 0     Pain Loc --      Pain Edu? --      Excl. in GC? --      Physical Exam Vitals and nursing note reviewed.  Constitutional:      General: He is not in acute distress.    Appearance: He is well-developed and well-nourished.  HENT:     Head: Normocephalic and atraumatic.  Eyes:     Conjunctiva/sclera: Conjunctivae normal.  Cardiovascular:     Rate and Rhythm: Tachycardia present. Rhythm irregular.     Heart sounds: Murmur heard.   Systolic murmur is present.   Pulmonary:     Effort: Pulmonary effort is normal. No  respiratory distress.     Breath sounds: No wheezing.  Abdominal:     General: There is no distension.  Musculoskeletal:        General: No edema.     Cervical back: Neck supple.  Skin:    General: Skin is warm.     Capillary Refill: Capillary refill takes less than 2 seconds.     Findings: No rash.  Neurological:     Mental Status: He is alert and oriented to person, place, and time.     Motor: No abnormal muscle tone.       ____________________________________________   LABS (all labs ordered are listed, but only abnormal results are displayed)  Labs Reviewed  BASIC METABOLIC PANEL - Abnormal; Notable for the following components:      Result Value   Glucose, Bld 138 (*)    All other components within normal limits  CBC - Abnormal; Notable for the following components:   Platelets 144 (*)    All other components within normal limits  URINALYSIS, COMPLETE (UACMP) WITH MICROSCOPIC - Abnormal; Notable for the following components:   Color, Urine STRAW (*)    APPearance CLEAR (*)    All other components within normal limits  SARS CORONAVIRUS 2 (TAT 6-24 HRS)  CBG MONITORING, ED  TROPONIN I (HIGH SENSITIVITY)  TROPONIN I (HIGH SENSITIVITY)    ____________________________________________  EKG: Normal sinus rhythm, VR 100. PR 160, QRS 86,  QTc 436. No acute st elevations or depressions. No ischemia or infarct. ________________________________________  RADIOLOGY All imaging, including plain films, CT scans, and ultrasounds, independently reviewed by me, and interpretations confirmed via formal radiology reads.  ED MD interpretation:   CT Head: NAICA, remote lacunar infarct R caudate  Official radiology report(s): CT Head Wo Contrast  Result Date: 11/03/2020 CLINICAL DATA:  Neuro deficit with acute stroke suspected. Weakness and near syncope EXAM: CT HEAD WITHOUT CONTRAST TECHNIQUE: Contiguous axial images were obtained from the base of the skull through the vertex without intravenous contrast. COMPARISON:  Brain MRI 04/18/2014 FINDINGS: Brain: No evidence of acute infarction, hemorrhage, hydrocephalus, extra-axial collection or mass lesion/mass effect. 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 Vascular: No hyperdense vessel or unexpected calcification. Skull: Normal. Negative for fracture or focal lesion. Sinuses/Orbits: Bilateral cataract resection. IMPRESSION: 1. No acute finding. 2. 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 Electronically Signed   By: Marnee Spring M.D.   On: 11/03/2020 11:18    ____________________________________________  PROCEDURES   Procedure(s) performed (including Critical Care):  .1-3 Lead EKG Interpretation Performed by: Shaune Pollack, MD Authorized by: Shaune Pollack, MD     Interpretation: non-specific     ECG rate:  90-110   ECG rate assessment: tachycardic     Rhythm: sinus rhythm     Ectopy: PVCs     Conduction: normal   Comments:     Indication: Near syncope    ____________________________________________  INITIAL IMPRESSION / MDM / ASSESSMENT AND PLAN / ED COURSE  As part of my medical decision making, I reviewed the following data within the electronic MEDICAL RECORD NUMBER Nursing notes reviewed and incorporated, Old chart reviewed, Notes from prior ED visits, and  [place] Controlled Substance Database       *RODRIGUEZ AGUINALDO was evaluated in Emergency Department on 11/03/2020 for the symptoms described in the history of present illness. He was evaluated in the context of the global COVID-19 pandemic, which necessitated consideration that the patient might be at risk for infection with  the SARS-CoV-2 virus that causes COVID-19. Institutional protocols and algorithms that pertain to the evaluation of patients at risk for COVID-19 are in a state of rapid change based on information released by regulatory bodies including the CDC and federal and state organizations. These policies and algorithms were followed during the patient's care in the ED.  Some ED evaluations and interventions may be delayed as a result of limited staffing during the pandemic.*     Medical Decision Making:  80 yo M with PMHx above here with near syncope x 2. On arrival, pt has no focal neuro deficits. CT head reviewed and shows no acute abnormality. Labs reviewed - CBC without significant leukocytosis, anemia. Lytes wnl. Trop negative. UA unremarkable. EKG shows SR with PVCs.  Interestingly, in ED, pt noted to be intermittently in ST with trigeminy. Exam does show systolic murmur. Old TTE shows mild TR. Will admit for obs, TTE, tele. COVID sent.  ____________________________________________  FINAL CLINICAL IMPRESSION(S) / ED DIAGNOSES  Final diagnoses:  Near syncope  Trigeminy  Tachycardia     MEDICATIONS GIVEN DURING THIS VISIT:  Medications - No data to display   ED Discharge Orders    None       Note:  This document was prepared using Dragon voice recognition software and may include unintentional dictation errors.   Shaune PollackIsaacs, Avalina Benko, MD 11/03/20 1312    Shaune PollackIsaacs, Tish Begin, MD 11/03/20 [phone]

## 2020-11-03 NOTE — ED Notes (Signed)
Pt provided with urinal to go to the bathroom, explained needing a urine sample. Pt states understanding. Pt walked to the bathroom without assistance, steady gait noted. Pt's wife at bedside at this time.

## 2020-11-03 NOTE — Consult Note (Signed)
[facility] Vascular Consult Note  MRN : [id number]  Alan Dennis is a 80 y.o. (02-Sep-1941) male who presents with chief complaint of  Chief Complaint  Patient presents with  . Weakness  . Near Syncope  .  History of Present Illness:   I am asked to evaluate Alan Dennis by Dr. Joylene Igo.  Alan Dennis is an 80 year old gentleman who presented to the emergency room earlier today after several episodes of syncope and dizziness.  He did describe some visual changes which occurred bilaterally and were like Sparks in front of his eyes.  He denies any focal motor deficit he denies any drooping of the face or slurred speech.  Upon arriving in the emergency room he was noted to be hypertensive.  A noncontrasted CT of the head was obtained demonstrating an old lacunar infarct but no acute bleed.  Duplex ultrasound of the carotids was also obtained which demonstrated a 50 to 69% right internal carotid artery stenosis and less than 40% left internal carotid artery stenosis.  Antegrade flow was noted in both vertebral arteries.  Current Meds  Medication Sig  . acetaminophen (TYLENOL) 500 MG tablet Take 1,000 mg by mouth every 8 (eight) hours as needed for mild pain or moderate pain.  Marland Kitchen acidophilus (RISAQUAD) CAPS capsule Take 1 capsule by mouth daily.  Marland Kitchen allopurinol (ZYLOPRIM) 100 MG tablet Take 200 mg by mouth daily.  . cholecalciferol (VITAMIN D3) 25 MCG (1000 UNIT) tablet Take 2,000 Units by mouth daily.  . Iron-Vitamin C (VITRON-C) 65-125 MG TABS Take 1 tablet by mouth daily.  Marland Kitchen latanoprost (XALATAN) 0.005 % ophthalmic solution Place 1 drop into both eyes at bedtime.   . Multiple Vitamins-Minerals (CENTRUM SILVER 50+MEN) TABS Take 1 tablet by mouth daily.  Marland Kitchen olmesartan (BENICAR) 40 MG tablet Take 20 mg by mouth daily.    Past Medical History:  Diagnosis Date  . Arthritis   . GERD (gastroesophageal reflux disease)   . Gout   . History of hiatal hernia     2018  . Hypertension   . Stroke [facility]) 2015   TIA - no deficits    Past Surgical History:  Procedure Laterality Date  . ABDOMINAL AORTIC ANEURYSM REPAIR  2005  . BACK SURGERY     spur removal, (also, major back surg in 1970s)  . ESOPHAGOGASTRODUODENOSCOPY (EGD) WITH PROPOFOL N/A 05/08/2017   Procedure: ESOPHAGOGASTRODUODENOSCOPY (EGD) WITH PROPOFOL;  Surgeon: Toney Reil, MD;  Location: [facility] ENDOSCOPY;  Service: Gastroenterology;  Laterality: N/A;  . EYE SURGERY     bilateral  . GANGLION CYST EXCISION Left 05/22/2017   Procedure: REMOVAL GANGLION OF WRIST;  Surgeon: Kennedy Bucker, MD;  Location: [facility] ORS;  Service: Orthopedics;  Laterality: Left;  . IMAGE GUIDED SINUS SURGERY N/A 02/20/2017   Procedure: IMAGE GUIDED SINUS SURGERY;  Surgeon: Vernie Murders, MD;  Location: [facility];  Service: ENT;  Laterality: N/A;  need disk GAVE DISK TO CECE 5-15 KP  . KNEE SURGERY Right   . NASAL SINUS SURGERY     several  . SHOULDER SURGERY Right   . SINUSOTOMY Right 02/20/2017   Procedure: SINUSOTOMY right endoscopic with removal content;  Surgeon: Vernie Murders, MD;  Location: [facility]ERY [facility];  Service: ENT;  Laterality: Right;    Social History Social History   Tobacco Use  . Smoking status: Former Smoker    Quit date: 1985    Years since quitting: 37.1  . Smokeless tobacco: Never Used  Vaping  Use  . Vaping Use: Never used  Substance Use Topics  . Alcohol use: Yes    Alcohol/week: 14.0 standard drinks    Types: 14 Glasses of wine per week  . Drug use: No    Family History No family history of bleeding/clotting disorders, porphyria or autoimmune disease   Allergies  Allergen Reactions  . Bactrim [Sulfamethoxazole-Trimethoprim] Hives  . Tetracycline Rash  . Lisinopril Other (See Comments)    Leg muscles ache  . Sulfa Antibiotics Hives and Rash  . Tetracyclines & Related Rash  . Zithromax [Azithromycin] Hives    Cough/ unsure     REVIEW OF SYSTEMS  (Negative unless checked)  Constitutional: [] Weight loss  [] Fever  [] Chills Cardiac: [] Chest pain   [] Chest pressure   [] Palpitations   [] Shortness of breath when laying flat   [] Shortness of breath at rest   [] Shortness of breath with exertion. Vascular:  [] Pain in legs with walking   [] Pain in legs at rest   [] Pain in legs when laying flat   [] Claudication   [] Pain in feet when walking  [] Pain in feet at rest  [] Pain in feet when laying flat   [] History of DVT   [] Phlebitis   [] Swelling in legs   [] Varicose veins   [] Non-healing ulcers Pulmonary:   [] Uses home oxygen   [] Productive cough   [] Hemoptysis   [] Wheeze  [] COPD   [] Asthma Neurologic:  [] Dizziness  [] Blackouts   [] Seizures   [x] History of stroke   [] History of TIA  [] Aphasia   [] Temporary blindness   [] Dysphagia   [] Weakness or numbness in arms   [] Weakness or numbness in legs Musculoskeletal:  [] Arthritis   [] Joint swelling   [x] Joint pain   [x] Low back pain Hematologic:  [] Easy bruising  [] Easy bleeding   [] Hypercoagulable state   [] Anemic  [] Hepatitis Gastrointestinal:  [] Blood in stool   [] Vomiting blood  [] Gastroesophageal reflux/heartburn   [] Difficulty swallowing. Genitourinary:  [] Chronic kidney disease   [] Difficult urination  [] Frequent urination  [] Burning with urination   [] Blood in urine Skin:  [] Rashes   [] Ulcers   [] Wounds Psychological:  [] History of anxiety   []  History of major depression.    Physical Examination  Vitals:   11/03/20 1216 11/03/20 1245 11/03/20 1447 11/03/20 1535  BP:  (!) 174/90 (!) 199/99 (!) 157/76  Pulse: (!) 105 96 74 (!) 102  Resp: 14 12 18    Temp:      TempSrc:      SpO2: 97% 96% 98% 97%  Weight:      Height:       Body mass index is 30.28 kg/m.  Head: [place]/AT, No temporalis wasting.  Ear/Nose/Throat: Nares w/o erythema or drainage, oropharynx w/o obsrtuction.  Eyes: PER, Sclera nonicteric.  Neck: Supple, no nuchal rigidity.  No bruit or JVD.  Pulmonary:  Breath sounds equal  bilaterally, no use of accessory muscles.  Cardiac: RRR, normal S1, S2, no Murmurs, rubs or gallops. Vascular: No carotid bruits, 2+ radial pulses bilaterally Gastrointestinal: soft, non-tender, non-distended.  Musculoskeletal: Moves all extremities.  No deformity or atrophy. No edema. Neurologic: CN 2-12 intact. Symmetrical.  Speech is fluent.  Psychiatric: Judgment intact, Mood & affect appropriate for pt's clinical situation. Dermatologic: No rashes or ulcers noted.  No cellulitis or open wounds.      CBC Lab Results  Component Value Date   WBC 7.1 11/03/2020   HGB 14.5 11/03/2020   HCT 44.0 11/03/2020   MCV 97.3 11/03/2020   PLT 144 (L)  11/03/2020    BMET    Component Value Date/Time   NA 138 11/03/2020 1055   NA 139 04/01/2014 2223   K 3.9 11/03/2020 1055   K 4.1 04/01/2014 2223   CL 104 11/03/2020 1055   CL 107 04/01/2014 2223   CO2 23 11/03/2020 1055   CO2 25 04/01/2014 2223   GLUCOSE 138 (H) 11/03/2020 1055   GLUCOSE 102 (H) 04/01/2014 2223   BUN 19 11/03/2020 1055   BUN 14 04/01/2014 2223   CREATININE 1.04 11/03/2020 1055   CREATININE 1.03 04/01/2014 2223   CALCIUM 9.7 11/03/2020 1055   CALCIUM 8.8 04/01/2014 2223   GFRNONAA >60 11/03/2020 1055   GFRNONAA >60 04/01/2014 2223   GFRAA >60 04/01/2014 2223   Estimated Creatinine Clearance: 64.7 mL/min (by C-G formula based on SCr of 1.04 mg/dL).  COAG No results found for: INR, PROTIME     Assessment/Plan 1.  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  Nevertheless, certainly given his remote lacunar infarct and his global symptoms intracranial disease could be a factor.  I think it is clearly indicated that we obtain a CT angiogram of the neck and head.  I have also recommended that he begin taking a baby aspirin on a  daily basis just based on the carotid stenosis identified by ultrasound.  I discussed this with him and he is in agreement.  We will order the CT scan while he is in-house.  2.  Hypertension: Continue antihypertensive medications as already ordered, these medications have been reviewed and there are no changes at this time.  3.  Gastroesophageal reflux disease:Continue PPI as already ordered, this medication has been reviewed and there are no changes at this time.  Avoidence of caffeine and alcohol  Moderate elevation of the head of the bed     Levora Dredge, MD  11/03/2020 6:48 PM

## 2020-11-03 NOTE — Consult Note (Signed)
[facility] Cardiology Consultation Note  Patient ID: Alan Dennis, MRN: [id number], DOB/AGE: Oct 22, 1940 80 y.o. Admit date: 11/03/2020   Date of Consult: 11/03/2020 Primary Physician: Barbette Reichmann, MD Primary Cardiologist: None  Chief Complaint:  Chief Complaint  Patient presents with  . Weakness  . Near Syncope   Reason for Consult: Syncope  HPI: 80 y.o. male with known hypertension and peripheral vascular disease who has been on appropriate medication management and doing quite well.  The patient had an episode where he was sitting waiting for his friend and had a dizzy spell and almost passed.  He was lightheaded and weak and not necessarily nauseated and diaphoretic.  He went home and did some other chores but then had another spell and felt significantly abnormal and was seen in the emergency room.  At that time the patient has an EKG showing normal sinus rhythm otherwise normal EKG.  Troponin was 6 and other electrolytes and were is normal.  Since then the patient has felt much better but there is been no other significant rhythm disturbances or other causes or acute issues.  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  This appears not to been a significant change from before.  The patient is hemodynamically stable  Past Medical History:  Diagnosis Date  . Arthritis   . GERD (gastroesophageal reflux disease)   . Gout   . History of hiatal hernia    2018  . Hypertension   . Stroke [facility]) 2015   TIA - no deficits      Surgical History:  Past Surgical History:  Procedure Laterality Date  . ABDOMINAL AORTIC ANEURYSM REPAIR  2005  . BACK SURGERY     spur removal, (also, major back surg in 1970s)  . ESOPHAGOGASTRODUODENOSCOPY (EGD) WITH PROPOFOL N/A 05/08/2017   Procedure: ESOPHAGOGASTRODUODENOSCOPY (EGD) WITH PROPOFOL;  Surgeon: Toney Reil, MD;  Location: [facility]  ENDOSCOPY;  Service: Gastroenterology;  Laterality: N/A;  . EYE SURGERY     bilateral  . GANGLION CYST EXCISION Left 05/22/2017   Procedure: REMOVAL GANGLION OF WRIST;  Surgeon: Kennedy Bucker, MD;  Location: [facility] ORS;  Service: Orthopedics;  Laterality: Left;  . IMAGE GUIDED SINUS SURGERY N/A 02/20/2017   Procedure: IMAGE GUIDED SINUS SURGERY;  Surgeon: Vernie Murders, MD;  Location: [facility];  Service: ENT;  Laterality: N/A;  need disk GAVE DISK TO CECE 5-15 KP  . KNEE SURGERY Right   . NASAL SINUS SURGERY     several  . SHOULDER SURGERY Right   . SINUSOTOMY Right 02/20/2017   Procedure: SINUSOTOMY right endoscopic with removal content;  Surgeon: Vernie Murders, MD;  Location: [facility];  Service: ENT;  Laterality: Right;     Home Meds: Prior to Admission medications   Medication Sig Start Date End Date Taking? Authorizing Provider  acetaminophen (TYLENOL) 500 MG tablet Take 1,000 mg by mouth every 8 (eight) hours as needed for mild pain or moderate pain.   Yes [provider]  acidophilus (RISAQUAD) CAPS capsule Take 1 capsule by mouth daily.   Yes [provider]  allopurinol (ZYLOPRIM) 100 MG tablet Take 200 mg by mouth daily.   Yes [provider]  cholecalciferol (VITAMIN D3) 25 MCG (1000 UNIT) tablet Take 2,000 Units by mouth daily.   Yes [provider]  Iron-Vitamin C (VITRON-C) 65-125 MG TABS Take 1 tablet by mouth daily.  Yes [provider]  latanoprost (XALATAN) 0.005 % ophthalmic solution Place 1 drop into both eyes at bedtime.    Yes [provider]  Multiple Vitamins-Minerals (CENTRUM SILVER 50+MEN) TABS Take 1 tablet by mouth daily.   Yes [provider]  olmesartan (BENICAR) 40 MG tablet Take 20 mg by mouth daily.   Yes [provider]    Inpatient Medications:  . allopurinol  200 mg Oral Daily  . aspirin EC  162 mg Oral Daily  . enoxaparin (LOVENOX) injection  40 mg Subcutaneous  Q24H  . hydrocortisone cream  1 application Topical BID  . latanoprost  1 drop Both Eyes QHS  . losartan  100 mg Oral Daily  . pantoprazole  40 mg Oral Daily  . sodium chloride flush  3 mL Intravenous Q12H  . sodium chloride flush  3 mL Intravenous Q12H  . triamcinolone  1 spray Nasal Daily   . sodium chloride      Allergies:  Allergies  Allergen Reactions  . Bactrim [Sulfamethoxazole-Trimethoprim] Hives  . Tetracycline Rash  . Lisinopril Other (See Comments)    Leg muscles ache  . Sulfa Antibiotics Hives and Rash  . Tetracyclines & Related Rash  . Zithromax [Azithromycin] Hives    Cough/ unsure    Social History   Socioeconomic History  . Marital status: Married    Spouse name: Not on file  . Number of children: Not on file  . Years of education: Not on file  . Highest education level: Not on file  Occupational History  . Not on file  Tobacco Use  . Smoking status: Former Smoker    Quit date: 1985    Years since quitting: 37.1  . Smokeless tobacco: Never Used  Vaping Use  . Vaping Use: Never used  Substance and Sexual Activity  . Alcohol use: Yes    Alcohol/week: 14.0 standard drinks    Types: 14 Glasses of wine per week  . Drug use: No  . Sexual activity: Not on file  Other Topics Concern  . Not on file  Social History Narrative  . Not on file   Social Determinants of Health   Financial Resource Strain: Not on file  Food Insecurity: Not on file  Transportation Needs: Not on file  Physical Activity: Not on file  Stress: Not on file  Social Connections: Not on file  Intimate Partner Violence: Not on file     History reviewed. No pertinent family history.   Review of Systems Positive for dizziness presyncope Negative for: General:  chills, fever, night sweats or weight changes.  Cardiovascular: PND orthopnea positive for syncope dizziness  Dermatological skin lesions rashes Respiratory: Cough congestion Urologic: Frequent urination urination at  night and hematuria Abdominal: negative for nausea, vomiting, diarrhea, bright red blood per rectum, melena, or hematemesis Neurologic: negative for visual changes, and/or hearing changes  All other systems reviewed and are otherwise negative except as noted above.  Labs: No results for input(s): CKTOTAL, CKMB, TROPONINI in the last 72 hours. Lab Results  Component Value Date   WBC 7.1 11/03/2020   HGB 14.5 11/03/2020   HCT 44.0 11/03/2020   MCV 97.3 11/03/2020   PLT 144 (L) 11/03/2020    Recent Labs  Lab 11/03/20 1055  NA 138  K 3.9  CL 104  CO2 23  BUN 19  CREATININE 1.04  CALCIUM 9.7  GLUCOSE 138*   No results found for: CHOL, HDL, LDLCALC, TRIG No results found for: DDIMER  Radiology/Studies:  CT Head Wo Contrast  Result Date: 11/03/2020 CLINICAL DATA:  Neuro deficit with acute stroke suspected. Weakness and near syncope EXAM: CT HEAD WITHOUT CONTRAST TECHNIQUE: Contiguous axial images were obtained from the base of the skull through the vertex without intravenous contrast. COMPARISON:  Brain MRI 04/18/2014 FINDINGS: Brain: No evidence of acute infarction, hemorrhage, hydrocephalus, extra-axial collection or mass lesion/mass effect. 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 Vascular: No hyperdense vessel or unexpected calcification. Skull: Normal. Negative for fracture or focal lesion. Sinuses/Orbits: Bilateral cataract resection. IMPRESSION: 1. No acute finding. 2. 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 Electronically Signed   By: Marnee Spring M.D.   On: 11/03/2020 11:18   US Carotid Bilateral  Result Date: 10/26/2020 CLINICAL DATA:  Left hand weakness, hypertension, vascular disease EXAM: BILATERAL CAROTID DUPLEX ULTRASOUND TECHNIQUE: Wallace Cullens scale imaging, color Doppler and duplex ultrasound were performed of bilateral carotid and vertebral arteries in the neck. COMPARISON:  None. FINDINGS: Criteria: Quantification of carotid stenosis is based on velocity  parameters that correlate the residual internal carotid diameter with NASCET-based stenosis levels, using the diameter of the distal internal carotid lumen as the denominator for stenosis measurement. The following velocity measurements were obtained: RIGHT ICA: 198/43 cm/sec CCA: 111/16 cm/sec SYSTOLIC ICA/CCA RATIO:  1.8 ECA: 141 cm/sec LEFT ICA: 143/28 cm/sec CCA: 144/19 cm/sec SYSTOLIC ICA/CCA RATIO:  1.0 ECA: 190 cm/sec RIGHT CAROTID ARTERY: Echogenic shadowing calcification at the right proximal ICA. There is velocity elevation in this region just distal to the calcification measuring 198/43 centimeters/second but without significant turbulent flow or spectral broadening. Right ICA stenosis is estimated at 50-69% by velocity criteria. RIGHT VERTEBRAL ARTERY:  Antegrade flow LEFT CAROTID ARTERY: Similar intimal thickening and minor atherosclerotic change. No hemodynamically significant left ICA stenosis, significant velocity elevation, turbulent flow. Proximal ICA tortuosity noted. Degree of stenosis estimated less than 50%. LEFT VERTEBRAL ARTERY:  Normal antegrade flow IMPRESSION: Right ICA stenosis estimated at 50-69% Left ICA narrowing less than 50% Patent antegrade vertebral flow bilaterally Electronically Signed   By: Judie Petit.  Shick M.D.   On: 10/26/2020 15:16    EKG: Normal sinus rhythm otherwise normal EKG  Weights: Filed Weights   11/03/20 1045  Weight: 95.7 kg     Physical Exam: Blood pressure (!) 157/76, pulse (!) 102, temperature 98.6 F (37 C), temperature source Oral, resp. rate 18, height 5\' 10"  (1.778 m), weight 95.7 kg, SpO2 97 %. Body mass index is 30.28 kg/m. General: Well developed, well nourished, in no acute distress. Head eyes ears nose throat: Normocephalic, atraumatic, sclera non-icteric, no xanthomas, nares are without discharge. No apparent thyromegaly and/or mass  Lungs: Normal respiratory effort.  no wheezes, no rales, no rhonchi.  Heart: RRR with normal S1 S2. no  murmur gallop, no rub, PMI is normal size and placement, carotid upstroke normal without bruit, jugular venous pressure is normal Abdomen: Soft, non-tender, non-distended with normoactive bowel sounds. No hepatomegaly. No rebound/guarding. No obvious abdominal masses. Abdominal aorta is normal size without bruit Extremities: No edema. no cyanosis, no clubbing, no ulcers  Peripheral : 2+ bilateral upper extremity pulses, 2+ bilateral femoral pulses, 2+ bilateral dorsal pedal pulse Neuro: Alert and oriented. No facial asymmetry. No focal deficit. Moves all extremities spontaneously. Musculoskeletal: Normal muscle tone without kyphosis Psych:  Responds to questions appropriately with a normal affect.    Assessment: 80 year old male with peripheral vascular disease hypertension hyperlipidemia on appropriate medications with presyncope and no current evidence of myocardial infarction and/or congestive heart  failure  Plan: 1.  Continue observation with telemetry to assess for rhythm disturbances as a cause of his current symptoms 2.  Further consideration of echocardiogram for LV systolic dysfunction valvular heart disease contributing to above 3.  Ambulation to follow improvements of symptoms and evaluation of symptoms 4.  Hypertension control with losartan without change 5.  Further treatment options after above  Signed, Lamar Blinks M.D. [facility] Cardiology 11/03/2020, 4:45 PM

## 2020-11-03 NOTE — ED Triage Notes (Signed)
Pt comes into the ED via POV c/o weakness and near syncopal episode today.  Pt states he feels "shakey" and doesn't feel "right".  Pt currently has even and unlabored respirations. Pt states this started at 7:30 this morning. Pt states he also checked his blood pressure at home and it was elevated. Pt currently alert and oriented.

## 2020-11-03 NOTE — ED Notes (Signed)
EDP at bedside, UA collected and sent by this RN. Pt noted to be tachycardic upon arrival back to bedside.

## 2020-11-03 NOTE — ED Notes (Signed)
This RN to bedside, introduced self to patient. Pt states was at breakfast this morning when he had sudden flash of light and felt light headed, pt states symtpoms resolved, and was feeling better, was at home then had another episode of feeling the same, pt states was sitting down when he had sudden onset flash of light and light headed. Pt denies new numbness from baseline, denies continued symptoms at this time. Pt states was hypertensive at home, states "I might have had a panic attack". Pt A&O x4, NAD noted at this time.

## 2020-11-03 NOTE — Progress Notes (Signed)
PHARMACIST - PHYSICIAN ORDER COMMUNICATION  CONCERNING: P&T Medication Policy on Herbal Medications  DESCRIPTION:  This patient's order for:  Probiotic-10  has been noted.  This product(s) is classified as an "herbal" or natural product. Due to a lack of definitive safety studies or FDA approval, nonstandard manufacturing practices, plus the potential risk of unknown drug-drug interactions while on inpatient medications, the Pharmacy and Therapeutics Committee does not permit the use of "herbal" or natural products of this type within [facility].   ACTION TAKEN: The pharmacy department is unable to verify this order at this time. Please reevaluate patient's clinical condition at discharge and address if the herbal or natural product(s) should be resumed at that time.  Bari Mantis PharmD Clinical Pharmacist 11/03/2020

## 2020-11-04 DIAGNOSIS — R55 Syncope and collapse: Secondary | ICD-10-CM | POA: Diagnosis not present

## 2020-11-04 LAB — CBC
HCT: 41.4 % (ref 39.0–52.0)
Hemoglobin: 14.3 g/dL (ref 13.0–17.0)
MCH: 33.2 pg (ref 26.0–34.0)
MCHC: 34.5 g/dL (ref 30.0–36.0)
MCV: 96.1 fL (ref 80.0–100.0)
Platelets: 146 10*3/uL — ABNORMAL LOW (ref 150–400)
RBC: 4.31 MIL/uL (ref 4.22–5.81)
RDW: 12.4 % (ref 11.5–15.5)
WBC: 7.2 10*3/uL (ref 4.0–10.5)
nRBC: 0 % (ref 0.0–0.2)

## 2020-11-04 LAB — BASIC METABOLIC PANEL
Anion gap: 8 (ref 5–15)
BUN: 16 mg/dL (ref 8–23)
CO2: 28 mmol/L (ref 22–32)
Calcium: 9.2 mg/dL (ref 8.9–10.3)
Chloride: 103 mmol/L (ref 98–111)
Creatinine, Ser: 0.94 mg/dL (ref 0.61–1.24)
GFR, Estimated: >60 mL/min (ref >60)
Glucose, Bld: 100 mg/dL — ABNORMAL HIGH (ref 70–99)
Potassium: 3.9 mmol/L (ref 3.5–5.1)
Sodium: 139 mmol/L (ref 135–145)

## 2020-11-04 LAB — GLUCOSE, CAPILLARY: Glucose-Capillary: 94 mg/dL (ref 70–99)

## 2020-11-04 MED ORDER — ASPIRIN EC 81 MG PO TBEC: 162.0000 mg | DELAYED_RELEASE_TABLET | Freq: Every day | ORAL | Status: DC

## 2020-11-04 MED ORDER — ATORVASTATIN CALCIUM 20 MG PO TABS
20.0000 mg | ORAL_TABLET | Freq: Every day | ORAL | Status: DC
  Filled 2020-11-04: qty 1

## 2020-11-04 MED ORDER — ATORVASTATIN CALCIUM 40 MG PO TABS: 40.0000 mg | ORAL_TABLET | Freq: Every day | ORAL | 3 refills | Status: DC

## 2020-11-04 NOTE — Progress Notes (Signed)
Reviewed AVS with pt answered all questions

## 2020-11-04 NOTE — Progress Notes (Signed)
[facility] Cardiology [facility] Encounter Note  Patient: Alan Dennis / Admit Date: 11/03/2020 / Date of Encounter: 11/04/2020, 7:29 AM   Subjective: Patient feels well today.  No evidence of syncope or other symptoms suggestive of syncope or rhythm disturbances.  Telemetry shows normal sinus rhythm with occasional preventricular contraction.  There is no evidence of anginal symptoms or congestive heart failure.  Troponin is normal.  Review of Systems: Positive for: None Negative for: Vision change, hearing change, syncope, dizziness, nausea, vomiting,diarrhea, bloody stool, stomach pain, cough, congestion, diaphoresis, urinary frequency, urinary pain,skin lesions, skin rashes Others previously listed  Objective: Telemetry: Normal sinus rhythm Physical Exam: Blood pressure (!) 158/94, pulse 75, temperature 97.8 F (36.6 C), temperature source Oral, resp. rate 16, height 5\' 10"  (1.778 m), weight 95.5 kg, SpO2 98 %. Body mass index is 30.21 kg/m. General: Well developed, well nourished, in no acute distress. Head: Normocephalic, atraumatic, sclera non-icteric, no xanthomas, nares are without discharge. Neck: No apparent masses Lungs: Normal respirations with no wheezes, no rhonchi, no rales , no crackles   Heart: Regular rate and rhythm, normal S1 S2, no murmur, no rub, no gallop, PMI is normal size and placement, carotid upstroke normal without bruit, jugular venous pressure normal Abdomen: Soft, non-tender, non-distended with normoactive bowel sounds. No hepatosplenomegaly. Abdominal aorta is normal size without bruit Extremities: No edema, no clubbing, no cyanosis, no ulcers,  Peripheral: 2+ radial, 2+ femoral, 2+ dorsal pedal pulses Neuro: Alert and oriented. Moves all extremities spontaneously. Psych:  Responds to questions appropriately with a normal affect.  No intake or output data in the 24 hours ending 11/04/20 0729  Inpatient Medications:  . allopurinol  200 mg Oral  Daily  . aspirin EC  162 mg Oral Daily  . enoxaparin (LOVENOX) injection  40 mg Subcutaneous Q24H  . hydrocortisone cream  1 application Topical BID  . latanoprost  1 drop Both Eyes QHS  . losartan  100 mg Oral Daily  . pantoprazole  40 mg Oral Daily  . sodium chloride flush  3 mL Intravenous Q12H  . sodium chloride flush  3 mL Intravenous Q12H  . triamcinolone  1 spray Nasal Daily   Infusions:  . sodium chloride      Labs: Recent Labs    11/03/20 1055 11/04/20 0322  NA 138 139  K 3.9 3.9  CL 104 103  CO2 23 28  GLUCOSE 138* 100*  BUN 19 16  CREATININE 1.04 0.94  CALCIUM 9.7 9.2  MG 2.1  --    No results for input(s): AST, ALT, ALKPHOS, BILITOT, PROT, ALBUMIN in the last 72 hours. Recent Labs    11/03/20 1055 11/04/20 0322  WBC 7.1 7.2  HGB 14.5 14.3  HCT 44.0 41.4  MCV 97.3 96.1  PLT 144* 146*   No results for input(s): CKTOTAL, CKMB, TROPONINI in the last 72 hours. Invalid input(s): POCBNP No results for input(s): HGBA1C in the last 72 hours.   Weights: Filed Weights   11/03/20 1045 11/04/20 0500  Weight: 95.7 kg 95.5 kg     Radiology/Studies:  CT ANGIO HEAD W OR WO CONTRAST  Result Date: 11/03/2020 CLINICAL DATA:  Acute presentation with dizziness and near syncope. EXAM: CT ANGIOGRAPHY HEAD AND NECK TECHNIQUE: Multidetector CT imaging of the head and neck was performed using the standard protocol during bolus administration of intravenous contrast. Multiplanar CT image reconstructions and MIPs were obtained to evaluate the vascular anatomy. Carotid stenosis measurements (when applicable) are obtained utilizing NASCET criteria,  using the distal internal carotid diameter as the denominator. CONTRAST:  75mL OMNIPAQUE IOHEXOL 350 MG/ML SOLN COMPARISON:  None. Head CT earlier same day FINDINGS: Aortic arch: Aortic atherosclerosis. Branching pattern is normal. Right carotid system: Common carotid artery shows scattered atherosclerotic plaque but is widely patent to  the bifurcation. Extensive soft and calcified plaque in the ICA bulb. Minimal diameter measures 1.8 mm. Compared to a more distal cervical ICA diameter of 5.6 mm, this indicates a 68% stenosis. Left carotid system: Common carotid artery shows scattered plaque but is widely patent to the bifurcation. Soft and calcified plaque at the bifurcation and ICA bulb. Minimal diameter in the ICA bulb measures 2 mm. Compared to a more distal cervical ICA diameter of 5 mm, this indicates a 60% stenosis. Vertebral arteries: Calcified plaque at both vertebral artery origins. 30% stenosis estimated at the right vertebral artery origin. 30-50% stenosis estimated at the left vertebral artery origin. Beyond that, both vertebral arteries are widely patent through the cervical region to the foramen magnum. Skeleton: Pronounced cervical degenerative changes with large anterior osteophytes. No acute finding. Other neck: No mass or lymphadenopathy. Upper chest: Lung apices are clear. CTA HEAD FINDINGS Anterior circulation: Both internal carotid arteries are patent through the skull base and siphon regions. There is ordinary siphon atherosclerotic calcification but no stenosis greater than 30%. The anterior and middle cerebral vessels are patent. There is 30% stenosis of the right M1 segment. No large or medium branch vessel occlusion identified. Posterior circulation: Both vertebral arteries are patent to the basilar. No basilar stenosis. Posterior circulation branch vessels patent. There is some atherosclerotic narrowing in the right PCA branches. Venous sinuses: Patent and normal. Anatomic variants: None significant. Review of the MIP images confirms the above findings IMPRESSION: 1. No acute large or medium branch vessel intracranial occlusion. 2. Aortic atherosclerosis. 3. 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 60% stenosis of the left ICA bulb. 4. 30% stenosis of the right M1 segment. 30-50% stenosis of the left vertebral artery origin.  30% stenosis of the right vertebral artery origin. Some atherosclerotic narrowing in the right PCA branches. Aortic Atherosclerosis (ICD10-I70.0). Electronically Signed   By: Paulina FusiMark  Shogry M.D.   On: 11/03/2020 20:11   CT Head Wo Contrast  Result Date: 11/03/2020 CLINICAL DATA:  Neuro deficit with acute stroke suspected. Weakness and near syncope EXAM: CT HEAD WITHOUT CONTRAST TECHNIQUE: Contiguous axial images were obtained from the base of the skull through the vertex without intravenous contrast. COMPARISON:  Brain MRI 04/18/2014 FINDINGS: Brain: No evidence of acute infarction, hemorrhage, hydrocephalus, extra-axial collection or mass lesion/mass effect. 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 Vascular: No hyperdense vessel or unexpected calcification. Skull: Normal. Negative for fracture or focal lesion. Sinuses/Orbits: Bilateral cataract resection. IMPRESSION: 1. No acute finding. 2. 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 Electronically Signed   By: Marnee SpringJonathon  Watts M.D.   On: 11/03/2020 11:18   CT ANGIO NECK W OR WO CONTRAST  Result Date: 11/03/2020 CLINICAL DATA:  Acute presentation with dizziness and near syncope. EXAM: CT ANGIOGRAPHY HEAD AND NECK TECHNIQUE: Multidetector CT imaging of the head and neck was performed using the standard protocol during bolus administration of intravenous contrast. Multiplanar CT image reconstructions and MIPs were obtained to evaluate the vascular anatomy. Carotid stenosis measurements (when applicable) are obtained utilizing NASCET criteria, using the distal internal carotid diameter as the denominator. CONTRAST:  75mL OMNIPAQUE IOHEXOL 350 MG/ML SOLN COMPARISON:  None. Head CT earlier same day FINDINGS: Aortic  arch: Aortic atherosclerosis. Branching pattern is normal. Right carotid system: Common carotid artery shows scattered atherosclerotic plaque but is widely patent to the bifurcation. Extensive soft and calcified plaque in the ICA bulb. Minimal  diameter measures 1.8 mm. Compared to a more distal cervical ICA diameter of 5.6 mm, this indicates a 68% stenosis. Left carotid system: Common carotid artery shows scattered plaque but is widely patent to the bifurcation. Soft and calcified plaque at the bifurcation and ICA bulb. Minimal diameter in the ICA bulb measures 2 mm. Compared to a more distal cervical ICA diameter of 5 mm, this indicates a 60% stenosis. Vertebral arteries: Calcified plaque at both vertebral artery origins. 30% stenosis estimated at the right vertebral artery origin. 30-50% stenosis estimated at the left vertebral artery origin. Beyond that, both vertebral arteries are widely patent through the cervical region to the foramen magnum. Skeleton: Pronounced cervical degenerative changes with large anterior osteophytes. No acute finding. Other neck: No mass or lymphadenopathy. Upper chest: Lung apices are clear. CTA HEAD FINDINGS Anterior circulation: Both internal carotid arteries are patent through the skull base and siphon regions. There is ordinary siphon atherosclerotic calcification but no stenosis greater than 30%. The anterior and middle cerebral vessels are patent. There is 30% stenosis of the right M1 segment. No large or medium branch vessel occlusion identified. Posterior circulation: Both vertebral arteries are patent to the basilar. No basilar stenosis. Posterior circulation branch vessels patent. There is some atherosclerotic narrowing in the right PCA branches. Venous sinuses: Patent and normal. Anatomic variants: None significant. Review of the MIP images confirms the above findings IMPRESSION: 1. No acute large or medium branch vessel intracranial occlusion. 2. Aortic atherosclerosis. 3. 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 60% stenosis of the left ICA bulb. 4. 30% stenosis of the right M1 segment. 30-50% stenosis of the left vertebral artery origin. 30% stenosis of the right vertebral artery origin. Some atherosclerotic  narrowing in the right PCA branches. Aortic Atherosclerosis (ICD10-I70.0). Electronically Signed   By: Paulina Fusi M.D.   On: 11/03/2020 20:11   US Carotid Bilateral  Result Date: 10/26/2020 CLINICAL DATA:  Left hand weakness, hypertension, vascular disease EXAM: BILATERAL CAROTID DUPLEX ULTRASOUND TECHNIQUE: Wallace Cullens scale imaging, color Doppler and duplex ultrasound were performed of bilateral carotid and vertebral arteries in the neck. COMPARISON:  None. FINDINGS: Criteria: Quantification of carotid stenosis is based on velocity parameters that correlate the residual internal carotid diameter with NASCET-based stenosis levels, using the diameter of the distal internal carotid lumen as the denominator for stenosis measurement. The following velocity measurements were obtained: RIGHT ICA: 198/43 cm/sec CCA: 111/16 cm/sec SYSTOLIC ICA/CCA RATIO:  1.8 ECA: 141 cm/sec LEFT ICA: 143/28 cm/sec CCA: 144/19 cm/sec SYSTOLIC ICA/CCA RATIO:  1.0 ECA: 190 cm/sec RIGHT CAROTID ARTERY: Echogenic shadowing calcification at the right proximal ICA. There is velocity elevation in this region just distal to the calcification measuring 198/43 centimeters/second but without significant turbulent flow or spectral broadening. Right ICA stenosis is estimated at 50-69% by velocity criteria. RIGHT VERTEBRAL ARTERY:  Antegrade flow LEFT CAROTID ARTERY: Similar intimal thickening and minor atherosclerotic change. No hemodynamically significant left ICA stenosis, significant velocity elevation, turbulent flow. Proximal ICA tortuosity noted. Degree of stenosis estimated less than 50%. LEFT VERTEBRAL ARTERY:  Normal antegrade flow IMPRESSION: Right ICA stenosis estimated at 50-69% Left ICA narrowing less than 50% Patent antegrade vertebral flow bilaterally Electronically Signed   By: Judie Petit.  Shick M.D.   On: 10/26/2020 15:16     Assessment and Recommendation  80 y.o. male with hypertension hyperlipidemia peripheral vascular disease with a  presyncopal episode but no primary syncope and no evidence of non-ST elevation myocardial infarction acute coronary syndrome or congestive heart failure or rhythm disturbances at this time 1.  Continue hypertension controlled with losartan without change at this time 2.  Continuation of aspirin for further risk reduction cardiovascular event 3.  Statin therapy for peripheral vascular disease if patient able to tolerate 4.  If ambulating well and no further significant symptoms of presyncope syncope or rhythm disturbances okay for discharged home with follow-up next week for further diagnostic testing and/or treatment options  Signed, Arnoldo Hooker M.D. FACC

## 2020-11-04 NOTE — Progress Notes (Signed)
Pt wants to speak with MD for new meds started ( Lipitor)  does not feel comfortable taking until then. Paged danford MD

## 2020-11-04 NOTE — Discharge Summary (Signed)
Physician Discharge Summary  Alan Dennis ZOX:[id number] DOB: [birth year] DOA: 11/03/2020  PCP: Alan Reichmann, MD  Admit date: 11/03/2020 Discharge date: 11/04/2020  Admitted From: Home  Disposition:  Home   Recommendations for Outpatient Follow-up:  1. Follow up with PCP Alan Dennis in 1 week 2. Follow up with Cardiology as directed 3. Follow up with Vascular surgery as directed 4. Alan Dennis: Please recheck BP and adjust antihypertensives as needed 5. Alan Dennis: Please screen for cognitive impairment/dementia when at baseline       Home Health: None  Equipment/Devices: None new  Discharge Condition: Good  CODE STATUS: FULL Diet recommendation: Cardiac  Brief/Interim Summary: Alan Dennis is an 80 y.o. M with HTN, hx TIA, carotid disease, AAA who presented with some episodes of presyncope and dizziness.  Patient was in his usual state of health until the day of admission when he had 2 episodes in which she felt lightheaded, near like he was going to pass out, and like there were spots in front of his eyes and lightheadedness and then this past.  In the ER the patient was hypertensive, CT head unremarkable.  An EKG was unremarkable compared to his prior.  Hospital service was asked to evaluate for presyncope.      PRINCIPAL HOSPITAL DIAGNOSIS: Pre-syncope    Discharge Diagnoses:   Near syncope-like episodes These episodes appear to have been global and pre-syncope like.    He was observed overnight and had several episodes like this, and telemetry was reviewed with no arrhythmia at all.  He thinks they may be "panic attacks".  Orthostatic vital signs normal.  He was evaluated by cardiology this morning who recommended outpatient follow-up.  He was evaluated by vascular surgery given his history of carotid stenosis, they felt that his episodes were clearly not related to focal carotid disease, obtained CT angiogram of the head and neck that showed stable  carotid disease, scattered moderate atherosclerotic plaque in the intracranial arteries.  Patient was started on a statin (LFTs normal 2 weeks ago), recommended to continue his low-dose aspirin, and discharged to follow-up with his PCP.   Dementia Patient appeared very forgetful throughout our discussion.          Discharge Instructions  Discharge Instructions    Diet - low sodium heart healthy   Complete by: As directed    Discharge instructions   Complete by: As directed    You were evaluated for feeling dizzy and nearly passing out.  Based on our testing and assessment, and evaluation by the heart specialist and vascular specialist, we are confident that these symptoms were not from a stroke, abnormal heart rhythm, heart attack, or new dangerous illness.  Overnight, you had a few similar episodes, and your heart monitoring was reassuring.    Your CAT scan of hte head showed fatty plaque buildup but no findings that were emergent.  For these fatty plaques, you should take aspirin 81 mg from now on You should also start atorvastatin/Lipitor 40 mg nightly  Go see Alan Dennis in 1 week   Increase activity slowly   Complete by: As directed      Allergies as of 11/04/2020      Reactions   Bactrim [sulfamethoxazole-trimethoprim] Hives   Tetracycline Rash   Lisinopril Other (See Comments)   Leg muscles ache   Sulfa Antibiotics Hives, Rash   Tetracyclines & Related Rash   Zithromax [azithromycin] Hives   Cough/ unsure      Medication List  TAKE these medications   acetaminophen 500 MG tablet Commonly known as: TYLENOL Take 1,000 mg by mouth every 8 (eight) hours as needed for mild pain or moderate pain.   acidophilus Caps capsule Take 1 capsule by mouth daily.   allopurinol 100 MG tablet Commonly known as: ZYLOPRIM Take 200 mg by mouth daily.   aspirin EC 81 MG tablet Take 2 tablets (162 mg total) by mouth daily.   atorvastatin 40 MG tablet Commonly known  as: LIPITOR Take 1 tablet (40 mg total) by mouth daily at 6 PM.   Centrum Silver 50+Men Tabs Take 1 tablet by mouth daily.   cholecalciferol 25 MCG (1000 UNIT) tablet Commonly known as: VITAMIN D3 Take 2,000 Units by mouth daily.   latanoprost 0.005 % ophthalmic solution Commonly known as: XALATAN Place 1 drop into both eyes at bedtime.   olmesartan 40 MG tablet Commonly known as: BENICAR Take 20 mg by mouth daily.   Vitron-C 65-125 MG Tabs Generic drug: Iron-Vitamin C Take 1 tablet by mouth daily.       Follow-up Information    Schnier, Latina CraverGregory G, MD In 1 month.   Specialties: Vascular Surgery, Cardiology, Radiology, Vascular Surgery Why: patient to make own follow up appointment; no studies Contact information: [address] [place] [address] [phone]        Alan BlinksKowalski, Bruce J, MD.   Specialty: Cardiology Why: patient to make own follow up appointment Contact information: [address] [facility] [place] [id number] [phone]        Alan ReichmannHande, Vishwanath, MD. Schedule an appointment as soon as possible for a visit in 1 week(s).   Specialty: Internal Medicine Contact information: [address] [facility] [place] [phone]              Allergies  Allergen Reactions  . Bactrim [Sulfamethoxazole-Trimethoprim] Hives  . Tetracycline Rash  . Lisinopril Other (See Comments)    Leg muscles ache  . Sulfa Antibiotics Hives and Rash  . Tetracyclines & Related Rash  . Zithromax [Azithromycin] Hives    Cough/ unsure    Consultations:  Vascular Surgery  Cardiology   Procedures/Studies: CT ANGIO HEAD W OR WO CONTRAST  Result Date: 11/03/2020 CLINICAL DATA:  Acute presentation with dizziness and near syncope. EXAM: CT ANGIOGRAPHY HEAD AND NECK TECHNIQUE: Multidetector CT imaging of the head and neck was performed using the standard protocol during bolus administration of  intravenous contrast. Multiplanar CT image reconstructions and MIPs were obtained to evaluate the vascular anatomy. Carotid stenosis measurements (when applicable) are obtained utilizing NASCET criteria, using the distal internal carotid diameter as the denominator. CONTRAST:  75mL OMNIPAQUE IOHEXOL 350 MG/ML SOLN COMPARISON:  None. Head CT earlier same day FINDINGS: Aortic arch: Aortic atherosclerosis. Branching pattern is normal. Right carotid system: Common carotid artery shows scattered atherosclerotic plaque but is widely patent to the bifurcation. Extensive soft and calcified plaque in the ICA bulb. Minimal diameter measures 1.8 mm. Compared to a more distal cervical ICA diameter of 5.6 mm, this indicates a 68% stenosis. Left carotid system: Common carotid artery shows scattered plaque but is widely patent to the bifurcation. Soft and calcified plaque at the bifurcation and ICA bulb. Minimal diameter in the ICA bulb measures 2 mm. Compared to a more distal cervical ICA diameter of 5 mm, this indicates a 60% stenosis. Vertebral arteries: Calcified plaque at both vertebral artery origins. 30% stenosis estimated at the right vertebral artery origin. 30-50% stenosis estimated at  the left vertebral artery origin. Beyond that, both vertebral arteries are widely patent through the cervical region to the foramen magnum. Skeleton: Pronounced cervical degenerative changes with large anterior osteophytes. No acute finding. Other neck: No mass or lymphadenopathy. Upper chest: Lung apices are clear. CTA HEAD FINDINGS Anterior circulation: Both internal carotid arteries are patent through the skull base and siphon regions. There is ordinary siphon atherosclerotic calcification but no stenosis greater than 30%. The anterior and middle cerebral vessels are patent. There is 30% stenosis of the right M1 segment. No large or medium branch vessel occlusion identified. Posterior circulation: Both vertebral arteries are patent to  the basilar. No basilar stenosis. Posterior circulation branch vessels patent. There is some atherosclerotic narrowing in the right PCA branches. Venous sinuses: Patent and normal. Anatomic variants: None significant. Review of the MIP images confirms the above findings IMPRESSION: 1. No acute large or medium branch vessel intracranial occlusion. 2. Aortic atherosclerosis. 3. 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 60% stenosis of the left ICA bulb. 4. 30% stenosis of the right M1 segment. 30-50% stenosis of the left vertebral artery origin. 30% stenosis of the right vertebral artery origin. Some atherosclerotic narrowing in the right PCA branches. Aortic Atherosclerosis (ICD10-I70.0). Electronically Signed   By: Paulina Fusi M.D.   On: 11/03/2020 20:11   CT Head Wo Contrast  Result Date: 11/03/2020 CLINICAL DATA:  Neuro deficit with acute stroke suspected. Weakness and near syncope EXAM: CT HEAD WITHOUT CONTRAST TECHNIQUE: Contiguous axial images were obtained from the base of the skull through the vertex without intravenous contrast. COMPARISON:  Brain MRI 04/18/2014 FINDINGS: Brain: No evidence of acute infarction, hemorrhage, hydrocephalus, extra-axial collection or mass lesion/mass effect. 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 Vascular: No hyperdense vessel or unexpected calcification. Skull: Normal. Negative for fracture or focal lesion. Sinuses/Orbits: Bilateral cataract resection. IMPRESSION: 1. No acute finding. 2. 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 Electronically Signed   By: Marnee Spring M.D.   On: 11/03/2020 11:18   CT ANGIO NECK W OR WO CONTRAST  Result Date: 11/03/2020 CLINICAL DATA:  Acute presentation with dizziness and near syncope. EXAM: CT ANGIOGRAPHY HEAD AND NECK TECHNIQUE: Multidetector CT imaging of the head and neck was performed using the standard protocol during bolus administration of intravenous contrast. Multiplanar CT image reconstructions and MIPs were  obtained to evaluate the vascular anatomy. Carotid stenosis measurements (when applicable) are obtained utilizing NASCET criteria, using the distal internal carotid diameter as the denominator. CONTRAST:  77mL OMNIPAQUE IOHEXOL 350 MG/ML SOLN COMPARISON:  None. Head CT earlier same day FINDINGS: Aortic arch: Aortic atherosclerosis. Branching pattern is normal. Right carotid system: Common carotid artery shows scattered atherosclerotic plaque but is widely patent to the bifurcation. Extensive soft and calcified plaque in the ICA bulb. Minimal diameter measures 1.8 mm. Compared to a more distal cervical ICA diameter of 5.6 mm, this indicates a 68% stenosis. Left carotid system: Common carotid artery shows scattered plaque but is widely patent to the bifurcation. Soft and calcified plaque at the bifurcation and ICA bulb. Minimal diameter in the ICA bulb measures 2 mm. Compared to a more distal cervical ICA diameter of 5 mm, this indicates a 60% stenosis. Vertebral arteries: Calcified plaque at both vertebral artery origins. 30% stenosis estimated at the right vertebral artery origin. 30-50% stenosis estimated at the left vertebral artery origin. Beyond that, both vertebral arteries are widely patent through the cervical region to the foramen magnum. Skeleton: Pronounced cervical degenerative changes with  large anterior osteophytes. No acute finding. Other neck: No mass or lymphadenopathy. Upper chest: Lung apices are clear. CTA HEAD FINDINGS Anterior circulation: Both internal carotid arteries are patent through the skull base and siphon regions. There is ordinary siphon atherosclerotic calcification but no stenosis greater than 30%. The anterior and middle cerebral vessels are patent. There is 30% stenosis of the right M1 segment. No large or medium branch vessel occlusion identified. Posterior circulation: Both vertebral arteries are patent to the basilar. No basilar stenosis. Posterior circulation branch vessels  patent. There is some atherosclerotic narrowing in the right PCA branches. Venous sinuses: Patent and normal. Anatomic variants: None significant. Review of the MIP images confirms the above findings IMPRESSION: 1. No acute large or medium branch vessel intracranial occlusion. 2. Aortic atherosclerosis. 3. 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 60% stenosis of the left ICA bulb. 4. 30% stenosis of the right M1 segment. 30-50% stenosis of the left vertebral artery origin. 30% stenosis of the right vertebral artery origin. Some atherosclerotic narrowing in the right PCA branches. Aortic Atherosclerosis (ICD10-I70.0). Electronically Signed   By: Paulina Fusi M.D.   On: 11/03/2020 20:11   US Carotid Bilateral  Result Date: 10/26/2020 CLINICAL DATA:  Left hand weakness, hypertension, vascular disease EXAM: BILATERAL CAROTID DUPLEX ULTRASOUND TECHNIQUE: Wallace Cullens scale imaging, color Doppler and duplex ultrasound were performed of bilateral carotid and vertebral arteries in the neck. COMPARISON:  None. FINDINGS: Criteria: Quantification of carotid stenosis is based on velocity parameters that correlate the residual internal carotid diameter with NASCET-based stenosis levels, using the diameter of the distal internal carotid lumen as the denominator for stenosis measurement. The following velocity measurements were obtained: RIGHT ICA: 198/43 cm/sec CCA: 111/16 cm/sec SYSTOLIC ICA/CCA RATIO:  1.8 ECA: 141 cm/sec LEFT ICA: 143/28 cm/sec CCA: 144/19 cm/sec SYSTOLIC ICA/CCA RATIO:  1.0 ECA: 190 cm/sec RIGHT CAROTID ARTERY: Echogenic shadowing calcification at the right proximal ICA. There is velocity elevation in this region just distal to the calcification measuring 198/43 centimeters/second but without significant turbulent flow or spectral broadening. Right ICA stenosis is estimated at 50-69% by velocity criteria. RIGHT VERTEBRAL ARTERY:  Antegrade flow LEFT CAROTID ARTERY: Similar intimal thickening and minor atherosclerotic  change. No hemodynamically significant left ICA stenosis, significant velocity elevation, turbulent flow. Proximal ICA tortuosity noted. Degree of stenosis estimated less than 50%. LEFT VERTEBRAL ARTERY:  Normal antegrade flow IMPRESSION: Right ICA stenosis estimated at 50-69% Left ICA narrowing less than 50% Patent antegrade vertebral flow bilaterally Electronically Signed   By: Judie Petit.  Shick M.D.   On: 10/26/2020 15:16       Subjective: Patient had a few episodes overnight, none this morning.  Discharge Exam: Vitals:   11/04/20 0523 11/04/20 0728  BP: (!) 158/94 (!) 164/82  Pulse: 75 68  Resp: 16 14  Temp: 97.8 F (36.6 C) 98.2 F (36.8 C)  SpO2: 98% 95%   Vitals:   11/04/20 0042 11/04/20 0500 11/04/20 0523 11/04/20 0728  BP: (!) 159/94  (!) 158/94 (!) 164/82  Pulse: 74  75 68  Resp: Temp: 97.8 F (36.6 C)  97.8 F (36.6 C) 98.2 F (36.8 C)  TempSrc: Oral  Oral Oral  SpO2: 96%  98% 95%  Weight:  95.5 kg    Height:        General: Pt is alert, awake, not in acute distress Cardiovascular: RRR, nl S1-S2, no murmurs appreciated.   No LE edema.   Respiratory: Normal respiratory rate and rhythm.  CTAB without rales or wheezes. Abdominal: Abdomen soft and non-tender.  No distension or HSM.   Neuro/Psych: Strength symmetric in upper and lower extremities.  Judgment and insight appear normal.   The results of significant diagnostics from this hospitalization (including imaging, microbiology, ancillary and laboratory) are listed below for reference.     Microbiology: Recent Results (from the past 240 hour(s))  SARS CORONAVIRUS 2 (TAT 6-24 HRS) Nasopharyngeal Nasopharyngeal Swab     Status: None   Collection Time: 11/03/20  1:03 PM   Specimen: Nasopharyngeal Swab  Result Value Ref Range Status   SARS Coronavirus 2 NEGATIVE NEGATIVE Final    Comment: (NOTE) SARS-CoV-2 target nucleic acids are NOT DETECTED.  The SARS-CoV-2 RNA is generally detectable in upper and  lower respiratory specimens during the acute phase of infection. Negative results do not preclude SARS-CoV-2 infection, do not rule out co-infections with other pathogens, and should not be used as the sole basis for treatment or other patient management decisions. Negative results must be combined with clinical observations, patient history, and epidemiological information. The expected result is Negative.  Fact Sheet for Patients: HairSlick.no  Fact Sheet for Healthcare Providers: quierodirigir.com  This test is not yet approved or cleared by the [place] FDA and  has been authorized for detection and/or diagnosis of SARS-CoV-2 by FDA under an Emergency Use Authorization (EUA). This EUA will remain  in effect (meaning this test can be used) for the duration of the COVID-19 declaration under Se ction 564(b)(1) of the Act, 21 U.S.C. section 360bbb-3(b)(1), unless the authorization is terminated or revoked sooner.  Performed at [facility], [address], [place], [place] [address]      Labs: BNP (last 3 results) No results for input(s): BNP in the last 8760 hours. Basic Metabolic Panel: Recent Labs  Lab 11/03/20 1055 11/04/20 0322  NA 138 139  K 3.9 3.9  CL 104 103  CO2 23 28  GLUCOSE 138* 100*  BUN 19 16  CREATININE 1.04 0.94  CALCIUM 9.7 9.2  MG 2.1  --    Liver Function Tests: No results for input(s): AST, ALT, ALKPHOS, BILITOT, PROT, ALBUMIN in the last 168 hours. No results for input(s): LIPASE, AMYLASE in the last 168 hours. No results for input(s): AMMONIA in the last 168 hours. CBC: Recent Labs  Lab 11/03/20 1055 11/04/20 0322  WBC 7.1 7.2  HGB 14.5 14.3  HCT 44.0 41.4  MCV 97.3 96.1  PLT 144* 146*   Cardiac Enzymes: No results for input(s): CKTOTAL, CKMB, CKMBINDEX, TROPONINI in the last 168 hours. BNP: Invalid input(s): POCBNP CBG: Recent Labs  Lab 11/03/20 2015  11/04/20 0520  GLUCAP 135* 94   D-Dimer No results for input(s): DDIMER in the last 72 hours. Hgb A1c No results for input(s): HGBA1C in the last 72 hours. Lipid Profile No results for input(s): CHOL, HDL, LDLCALC, TRIG, CHOLHDL, LDLDIRECT in the last 72 hours. Thyroid function studies No results for input(s): TSH, T4TOTAL, T3FREE, THYROIDAB in the last 72 hours.  Invalid input(s): FREET3 Anemia work up No results for input(s): VITAMINB12, FOLATE, FERRITIN, TIBC, IRON, RETICCTPCT in the last 72 hours. Urinalysis    Component Value Date/Time   COLORURINE STRAW (A) 11/03/2020 1213   APPEARANCEUR CLEAR (A) 11/03/2020 1213   LABSPEC 1.009 11/03/2020 1213   PHURINE 6.0 11/03/2020 1213   GLUCOSEU NEGATIVE 11/03/2020 1213   HGBUR NEGATIVE 11/03/2020 1213   BILIRUBINUR NEGATIVE 11/03/2020 1213   KETONESUR NEGATIVE 11/03/2020 1213  PROTEINUR NEGATIVE 11/03/2020 1213   NITRITE NEGATIVE 11/03/2020 1213   LEUKOCYTESUR NEGATIVE 11/03/2020 1213   Sepsis Labs Invalid input(s): PROCALCITONIN,  WBC,  LACTICIDVEN Microbiology Recent Results (from the past 240 hour(s))  SARS CORONAVIRUS 2 (TAT 6-24 HRS) Nasopharyngeal Nasopharyngeal Swab     Status: None   Collection Time: 11/03/20  1:03 PM   Specimen: Nasopharyngeal Swab  Result Value Ref Range Status   SARS Coronavirus 2 NEGATIVE NEGATIVE Final    Comment: (NOTE) SARS-CoV-2 target nucleic acids are NOT DETECTED.  The SARS-CoV-2 RNA is generally detectable in upper and lower respiratory specimens during the acute phase of infection. Negative results do not preclude SARS-CoV-2 infection, do not rule out co-infections with other pathogens, and should not be used as the sole basis for treatment or other patient management decisions. Negative results must be combined with clinical observations, patient history, and epidemiological information. The expected result is Negative.  Fact Sheet for  Patients: HairSlick.no  Fact Sheet for Healthcare Providers: quierodirigir.com  This test is not yet approved or cleared by the [place] FDA and  has been authorized for detection and/or diagnosis of SARS-CoV-2 by FDA under an Emergency Use Authorization (EUA). This EUA will remain  in effect (meaning this test can be used) for the duration of the COVID-19 declaration under Se ction 564(b)(1) of the Act, 21 U.S.C. section 360bbb-3(b)(1), unless the authorization is terminated or revoked sooner.  Performed at [facility], [address], [place], [place] [address]      Time coordinating discharge: 25 minutes      SIGNED:   Alberteen Sam, MD  [facility] 11/04/2020, 11:00 AM

## 2020-11-07 DIAGNOSIS — F410 Panic disorder [episodic paroxysmal anxiety] without agoraphobia: Secondary | ICD-10-CM | POA: Insufficient documentation

## 2020-11-07 DIAGNOSIS — R251 Tremor, unspecified: Secondary | ICD-10-CM | POA: Insufficient documentation

## 2020-11-07 DIAGNOSIS — Z79899 Other long term (current) drug therapy: Secondary | ICD-10-CM | POA: Insufficient documentation

## 2020-11-29 DIAGNOSIS — I6523 Occlusion and stenosis of bilateral carotid arteries: Secondary | ICD-10-CM | POA: Insufficient documentation

## 2020-11-29 DIAGNOSIS — R0602 Shortness of breath: Secondary | ICD-10-CM | POA: Insufficient documentation

## 2020-11-29 DIAGNOSIS — K219 Gastro-esophageal reflux disease without esophagitis: Secondary | ICD-10-CM

## 2020-11-29 DIAGNOSIS — E782 Mixed hyperlipidemia: Secondary | ICD-10-CM | POA: Insufficient documentation

## 2021-01-01 ENCOUNTER — Encounter (INDEPENDENT_AMBULATORY_CARE_PROVIDER_SITE_OTHER): Payer: Self-pay | Admitting: Vascular Surgery

## 2021-01-01 ENCOUNTER — Ambulatory Visit (INDEPENDENT_AMBULATORY_CARE_PROVIDER_SITE_OTHER): Payer: Medicare PPO | Admitting: Vascular Surgery

## 2021-01-01 ENCOUNTER — Other Ambulatory Visit: Payer: Self-pay

## 2021-01-01 VITALS — BP 149/81 | HR 94 | Resp 16 | Ht 70.5 in | Wt 212.2 lb

## 2021-01-01 DIAGNOSIS — I10 Essential (primary) hypertension: Secondary | ICD-10-CM

## 2021-01-01 DIAGNOSIS — I714 Abdominal aortic aneurysm, without rupture, unspecified: Secondary | ICD-10-CM

## 2021-01-01 DIAGNOSIS — I6523 Occlusion and stenosis of bilateral carotid arteries: Secondary | ICD-10-CM | POA: Diagnosis not present

## 2021-01-01 DIAGNOSIS — E782 Mixed hyperlipidemia: Secondary | ICD-10-CM | POA: Diagnosis not present

## 2021-01-01 MED ORDER — ROSUVASTATIN CALCIUM 20 MG PO TABS: 20.0000 mg | ORAL_TABLET | Freq: Every day | ORAL | 4 refills | Status: DC

## 2021-01-01 NOTE — Progress Notes (Signed)
MRN : [id number]  Alan Dennis is a 80 y.o. (1941-07-04) male who presents with chief complaint of  Chief Complaint  Patient presents with  . New Patient (Initial Visit)    Ref Vishwanath carotid stenosis  .  History of Present Illness:   The patient is seen for follow up evaluation of carotid stenosis status post CT angiogram. CT scan was done 11/03/2020. Patient reports that the test went well with no problems or complications.   The patient denies interval amaurosis fugax. There is no recent or interval TIA symptoms or focal motor deficits. There is no prior documented CVA.  The patient is taking enteric-coated aspirin 81 mg daily.  There is no history of migraine headaches. There is no history of seizures.  The patient has a history of coronary artery disease, no recent episodes of angina or shortness of breath. The patient denies PAD or claudication symptoms. There is a history of hyperlipidemia which is being treated with a statin.    CT angiogram is reviewed by me personally and shows 60%bilateral ICA stenosis.   Current Meds  Medication Sig  . acetaminophen (TYLENOL) 500 MG tablet Take 1,000 mg by mouth every 8 (eight) hours as needed for mild pain or moderate pain.  Marland Kitchen acidophilus (RISAQUAD) CAPS capsule Take 1 capsule by mouth daily.  Marland Kitchen allopurinol (ZYLOPRIM) 100 MG tablet Take 200 mg by mouth daily.  Marland Kitchen aspirin EC 81 MG tablet Take 2 tablets (162 mg total) by mouth daily.  . cholecalciferol (VITAMIN D3) 25 MCG (1000 UNIT) tablet Take 2,000 Units by mouth daily.  Marland Kitchen latanoprost (XALATAN) 0.005 % ophthalmic solution Place 1 drop into both eyes at bedtime.   Marland Kitchen olmesartan (BENICAR) 40 MG tablet Take 20 mg by mouth daily.  . rosuvastatin (CRESTOR) 20 MG tablet Take 1 tablet (20 mg total) by mouth daily.    Past Medical History:  Diagnosis Date  . Arthritis   . GERD (gastroesophageal reflux disease)   . Gout   . History of hiatal hernia    2018  . Hypertension    . Stroke [facility]) 2015   TIA - no deficits    Past Surgical History:  Procedure Laterality Date  . ABDOMINAL AORTIC ANEURYSM REPAIR  2005  . BACK SURGERY     spur removal, (also, major back surg in 1970s)  . ESOPHAGOGASTRODUODENOSCOPY (EGD) WITH PROPOFOL N/A 05/08/2017   Procedure: ESOPHAGOGASTRODUODENOSCOPY (EGD) WITH PROPOFOL;  Surgeon: Toney Reil, MD;  Location: [facility] ENDOSCOPY;  Service: Gastroenterology;  Laterality: N/A;  . EYE SURGERY     bilateral  . GANGLION CYST EXCISION Left 05/22/2017   Procedure: REMOVAL GANGLION OF WRIST;  Surgeon: Kennedy Bucker, MD;  Location: [facility] ORS;  Service: Orthopedics;  Laterality: Left;  . IMAGE GUIDED SINUS SURGERY N/A 02/20/2017   Procedure: IMAGE GUIDED SINUS SURGERY;  Surgeon: Vernie Murders, MD;  Location: [facility];  Service: ENT;  Laterality: N/A;  need disk GAVE DISK TO [facility] 5-15 KP  . KNEE SURGERY Right   . NASAL SINUS SURGERY     several  . SHOULDER SURGERY Right   . SINUSOTOMY Right 02/20/2017   Procedure: SINUSOTOMY right endoscopic with removal content;  Surgeon: Vernie Murders, MD;  Location: [facility];  Service: ENT;  Laterality: Right;    Social History Social History   Tobacco Use  . Smoking status: Former Smoker    Quit date: 1985    Years since quitting: 37.3  . Smokeless tobacco: Never  Used  Vaping Use  . Vaping Use: Never used  Substance Use Topics  . Alcohol use: Yes    Alcohol/week: 14.0 standard drinks    Types: 14 Glasses of wine per week  . Drug use: No    Family History Family History  Problem Relation Age of Onset  . Cancer Father   . Hypertension Sister   . Diabetes Sister   . Hypertension Brother   . Diabetes Brother   . Parkinson's disease Brother     Allergies  Allergen Reactions  . Bactrim [Sulfamethoxazole-Trimethoprim] Hives  . Tetracycline Rash  . Lisinopril Other (See Comments)    Leg muscles ache  . Sulfa Antibiotics Hives and Rash  . Tetracyclines & Related  Rash  . Zithromax [Azithromycin] Hives    Cough/ unsure     REVIEW OF SYSTEMS (Negative unless checked)  Constitutional: [] Weight loss  [] Fever  [] Chills Cardiac: [] Chest pain   [] Chest pressure   [] Palpitations   [] Shortness of breath when laying flat   [] Shortness of breath with exertion. Vascular:  [] Pain in legs with walking   [] Pain in legs at rest  [] History of DVT   [] Phlebitis   [] Swelling in legs   [] Varicose veins   [] Non-healing ulcers Pulmonary:   [] Uses home oxygen   [] Productive cough   [] Hemoptysis   [] Wheeze  [] COPD   [] Asthma Neurologic:  [] Dizziness   [] Seizures   [] History of stroke   [] History of TIA  [] Aphasia   [] Vissual changes   [] Weakness or numbness in arm   [] Weakness or numbness in leg Musculoskeletal:   [] Joint swelling   [] Joint pain   [] Low back pain Hematologic:  [] Easy bruising  [] Easy bleeding   [] Hypercoagulable state   [] Anemic Gastrointestinal:  [] Diarrhea   [] Vomiting  [] Gastroesophageal reflux/heartburn   [] Difficulty swallowing. Genitourinary:  [] Chronic kidney disease   [] Difficult urination  [] Frequent urination   [] Blood in urine Skin:  [] Rashes   [] Ulcers  Psychological:  [] History of anxiety   []  History of major depression.  Physical Examination  Vitals:   01/01/21 1001  BP: (!) 149/81  Pulse: 94  Resp: 16  Weight: 212 lb 3.2 oz (96.3 kg)  Height: 5' 10.5" (1.791 m)   Body mass index is 30.02 kg/m. Gen: WD/WN, NAD Head: [place]/AT, No temporalis wasting.  Ear/Nose/Throat: Hearing grossly intact, nares w/o erythema or drainage Eyes: PER, EOMI, sclera nonicteric.  Neck: Supple, no large masses.   Pulmonary:  Good air movement, no audible wheezing bilaterally, no use of accessory muscles.  Cardiac: RRR, no JVD Vascular: Vessel Right Left  Radial Palpable Palpable  Gastrointestinal: Non-distended. No guarding/no peritoneal signs.  Musculoskeletal: M/S 5/5 throughout.  No deformity or atrophy.  Neurologic: CN 2-12 intact. Symmetrical.   Speech is fluent. Motor exam as listed above. Psychiatric: Judgment intact, Mood & affect appropriate for pt's clinical situation. Dermatologic: No rashes or ulcers noted.  No changes consistent with cellulitis.   CBC Lab Results  Component Value Date   WBC 7.2 11/04/2020   HGB 14.3 11/04/2020   HCT 41.4 11/04/2020   MCV 96.1 11/04/2020   PLT 146 (L) 11/04/2020    BMET    Component Value Date/Time   NA 139 11/04/2020 0322   NA 139 04/01/2014 2223   K 3.9 11/04/2020 0322   K 4.1 04/01/2014 2223   CL 103 11/04/2020 0322   CL 107 04/01/2014 2223   CO2 28 11/04/2020 0322   CO2 25 04/01/2014 2223   GLUCOSE 100 (  H) 11/04/2020 0322   GLUCOSE 102 (H) 04/01/2014 2223   BUN 16 11/04/2020 0322   BUN 14 04/01/2014 2223   CREATININE 0.94 11/04/2020 0322   CREATININE 1.03 04/01/2014 2223   CALCIUM 9.2 11/04/2020 0322   CALCIUM 8.8 04/01/2014 2223   GFRNONAA >60 11/04/2020 0322   GFRNONAA >60 04/01/2014 2223   GFRAA >60 04/01/2014 2223   CrCl cannot be calculated (Patient's most recent lab result is older than the maximum 21 days allowed.).  COAG No results found for: INR, PROTIME  Radiology No results found.    Assessment/Plan 1. Bilateral carotid artery stenosis Recommend:  Given the patient's asymptomatic subcritical stenosis no further invasive testing or surgery at this time.  Duplex ultrasound shows 60% ICA stenosis bilaterally.  Continue antiplatelet therapy as prescribed Continue management of CAD, HTN and Hyperlipidemia Healthy heart diet,  encouraged exercise at least 4 times per week Follow up in 6 months with duplex ultrasound and physical exam   2. Abdominal aortic aneurysm (AAA) without rupture (HCC) No surgery or intervention at this time. The patient has an asymptomatic abdominal aortic aneurysm that is less than 4 cm in maximal diameter.  I have discussed the natural history of abdominal aortic aneurysm and the small risk of rupture for aneurysm less  than 5 cm in size.  However, as these small aneurysms tend to enlarge over time, continued surveillance with ultrasound or CT scan is mandatory.  I have also discussed optimizing medical management with hypertension and lipid control and the importance of abstinence from tobacco.  The patient is also encouraged to exercise a minimum of 30 minutes 4 times a week.  Should the patient develop new onset abdominal or back pain or signs of peripheral embolization they are instructed to seek medical attention immediately and to alert the physician providing care that they have an aneurysm.  The patient voices their understanding. The patient is over due for his nex duplex and hge will follow up with an aortic duplex in the next month or so.  3. Essential hypertension Continue antihypertensive medications as already ordered, these medications have been reviewed and there are no changes at this time.   4. Hyperlipidemia, mixed Continue statin as ordered and reviewed, no changes at this time     Levora Dredge, MD  01/01/2021 1:11 PM

## 2021-01-19 ENCOUNTER — Other Ambulatory Visit: Payer: Self-pay | Admitting: Student

## 2021-01-19 DIAGNOSIS — M5416 Radiculopathy, lumbar region: Secondary | ICD-10-CM

## 2021-01-19 DIAGNOSIS — M5412 Radiculopathy, cervical region: Secondary | ICD-10-CM

## 2021-02-02 ENCOUNTER — Other Ambulatory Visit: Payer: Self-pay

## 2021-02-02 ENCOUNTER — Other Ambulatory Visit: Payer: Self-pay | Admitting: Neurology

## 2021-02-02 ENCOUNTER — Ambulatory Visit: Payer: Medicare PPO

## 2021-02-02 ENCOUNTER — Ambulatory Visit
Admission: RE | Admit: 2021-02-02 | Discharge: 2021-02-02 | Disposition: A | Discharge status: Home / Self Care | Payer: Medicare PPO | Source: Ambulatory Visit | Attending: Student | Admitting: Student

## 2021-02-02 DIAGNOSIS — M5416 Radiculopathy, lumbar region: Secondary | ICD-10-CM | POA: Diagnosis not present

## 2021-02-02 DIAGNOSIS — G20 Parkinson's disease: Secondary | ICD-10-CM

## 2021-02-02 MED ORDER — GADOBUTROL 1 MMOL/ML IV SOLN
10.0000 mL | Freq: Once | INTRAVENOUS | Status: AC | PRN
  Administered 2021-02-02: 10 mL via INTRAVENOUS

## 2021-02-10 DIAGNOSIS — G20 Parkinson's disease: Secondary | ICD-10-CM | POA: Insufficient documentation

## 2021-02-15 ENCOUNTER — Other Ambulatory Visit: Payer: Self-pay

## 2021-02-15 ENCOUNTER — Ambulatory Visit
Admission: RE | Admit: 2021-02-15 | Discharge: 2021-02-15 | Disposition: A | Discharge status: Home / Self Care | Payer: Medicare PPO | Source: Ambulatory Visit | Attending: Neurology | Admitting: Neurology

## 2021-02-15 DIAGNOSIS — G20 Parkinson's disease: Secondary | ICD-10-CM | POA: Diagnosis not present

## 2021-03-04 ENCOUNTER — Other Ambulatory Visit (INDEPENDENT_AMBULATORY_CARE_PROVIDER_SITE_OTHER): Payer: Self-pay | Admitting: Vascular Surgery

## 2021-03-04 DIAGNOSIS — I723 Aneurysm of iliac artery: Secondary | ICD-10-CM

## 2021-03-04 DIAGNOSIS — I714 Abdominal aortic aneurysm, without rupture, unspecified: Secondary | ICD-10-CM

## 2021-03-08 ENCOUNTER — Other Ambulatory Visit: Payer: Self-pay

## 2021-03-08 ENCOUNTER — Ambulatory Visit (INDEPENDENT_AMBULATORY_CARE_PROVIDER_SITE_OTHER): Payer: Medicare PPO

## 2021-03-08 ENCOUNTER — Ambulatory Visit (INDEPENDENT_AMBULATORY_CARE_PROVIDER_SITE_OTHER): Payer: Medicare PPO | Admitting: Vascular Surgery

## 2021-03-08 VITALS — BP 126/70 | HR 50 | Resp 16 | Wt 211.6 lb

## 2021-03-08 DIAGNOSIS — I723 Aneurysm of iliac artery: Secondary | ICD-10-CM

## 2021-03-08 DIAGNOSIS — I6523 Occlusion and stenosis of bilateral carotid arteries: Secondary | ICD-10-CM

## 2021-03-08 DIAGNOSIS — I714 Abdominal aortic aneurysm, without rupture, unspecified: Secondary | ICD-10-CM

## 2021-03-08 DIAGNOSIS — I10 Essential (primary) hypertension: Secondary | ICD-10-CM

## 2021-03-08 DIAGNOSIS — E782 Mixed hyperlipidemia: Secondary | ICD-10-CM

## 2021-03-08 DIAGNOSIS — K219 Gastro-esophageal reflux disease without esophagitis: Secondary | ICD-10-CM

## 2021-03-12 ENCOUNTER — Encounter (INDEPENDENT_AMBULATORY_CARE_PROVIDER_SITE_OTHER): Payer: Self-pay | Admitting: Vascular Surgery

## 2021-03-12 NOTE — Progress Notes (Signed)
MRN : [id number]  Alan Dennis is a 80 y.o. (15-May-1941) male who presents with chief complaint of  Chief Complaint  Patient presents with   Follow-up    Ultrasound follow up  .  History of Present Illness: The patient returns to the office for surveillance of a known abdominal aortic aneurysm. He is s/p open repair in 2005.  Patient denies abdominal pain or back pain, no other abdominal complaints. No changes suggesting embolic episodes.   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 There is no history of claudication or rest pain symptoms of the lower extremities. The patient denies angina or shortness of breath.   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   Current Meds  Medication Sig   acidophilus (RISAQUAD) CAPS capsule Take 1 capsule by mouth daily.   allopurinol (ZYLOPRIM) 100 MG tablet Take 200 mg by mouth daily.   aspirin EC 81 MG tablet Take 2 tablets (162 mg total) by mouth daily.   cholecalciferol (VITAMIN D3) 25 MCG (1000 UNIT) tablet Take 2,000 Units by mouth daily.   latanoprost (XALATAN) 0.005 % ophthalmic solution Place 1 drop into both eyes at bedtime.    olmesartan (BENICAR) 40 MG tablet Take 20 mg by mouth daily.    Past Medical History:  Diagnosis Date   Arthritis    GERD (gastroesophageal reflux disease)    Gout    History of hiatal hernia    2018   Hypertension    Stroke (HCC) 2015   TIA - no deficits    Past Surgical History:  Procedure Laterality Date   ABDOMINAL AORTIC ANEURYSM REPAIR  2005   BACK SURGERY     spur removal, (also, major back surg in 1970s)   ESOPHAGOGASTRODUODENOSCOPY (EGD) WITH PROPOFOL N/A 05/08/2017   Procedure: ESOPHAGOGASTRODUODENOSCOPY (EGD) WITH PROPOFOL;  Surgeon: Toney ReilVanga, Rohini Reddy, MD;  Location: [facility] ENDOSCOPY;  Service: Gastroenterology;  Laterality: N/A;   EYE SURGERY      bilateral   GANGLION CYST EXCISION Left 05/22/2017   Procedure: REMOVAL GANGLION OF WRIST;  Surgeon: Kennedy BuckerMenz, Michael, MD;  Location: [facility] ORS;  Service: Orthopedics;  Laterality: Left;   IMAGE GUIDED SINUS SURGERY N/A 02/20/2017   Procedure: IMAGE GUIDED SINUS SURGERY;  Surgeon: Vernie MurdersJuengel, Paul, MD;  [facility];  Service: ENT;  Laterality: N/A;  need disk GAVE DISK TO [facility] 5-15 KP   KNEE SURGERY Right    NASAL SINUS SURGERY     several   SHOULDER SURGERY Right    SINUSOTOMY Right 02/20/2017   Procedure: SINUSOTOMY right endoscopic with removal content;  Surgeon: Vernie MurdersJuengel, Paul, MD;  [facility];  Service: ENT;  Laterality: Right;    Social History Social History   Tobacco Use   Smoking status: Former    Pack years: 0.00    Types: Cigarettes    Quit date: 1985    Years since quitting: 37.5   Smokeless tobacco: Never  Vaping Use   Vaping Use: Never used  Substance Use Topics   Alcohol use: Yes    Alcohol/week: 14.0 standard drinks    Types: 14 Glasses of wine per week   Drug use: No    Family History Family History  Problem Relation Age of Onset   Cancer Father    Hypertension Sister    Diabetes Sister    Hypertension  Brother    Diabetes Brother    Parkinson's disease Brother     Allergies  Allergen Reactions   Rosuvastatin     Other reaction(s): Abdominal Pain Patient stated it made him feel jittery    Atorvastatin     Other reaction(s): Abdominal Pain Patient stated this made him jittery    Bactrim [Sulfamethoxazole-Trimethoprim] Hives   Tetracycline Rash   Lisinopril Other (See Comments)    Leg muscles ache   Sulfa Antibiotics Hives and Rash   Tetracyclines & Related Rash   Zithromax [Azithromycin] Hives    Cough/ unsure     REVIEW OF SYSTEMS (Negative unless checked)  Constitutional: [] Weight loss  [] Fever  [] Chills Cardiac: [] Chest pain   [] Chest pressure   [] Palpitations   [] Shortness of breath when laying flat    [] Shortness of breath with exertion. Vascular:  [] Pain in legs with walking   [] Pain in legs at rest  [] History of DVT   [] Phlebitis   [] Swelling in legs   [] Varicose veins   [] Non-healing ulcers Pulmonary:   [] Uses home oxygen   [] Productive cough   [] Hemoptysis   [] Wheeze  [] COPD   [] Asthma Neurologic:  [x] Dizziness   [] Seizures   [] History of stroke   [] History of TIA  [] Aphasia   [] Vissual changes   [] Weakness or numbness in arm   [] Weakness or numbness in leg Musculoskeletal:   [] Joint swelling   [x] Joint pain   [] Low back pain Hematologic:  [] Easy bruising  [] Easy bleeding   [] Hypercoagulable state   [] Anemic Gastrointestinal:  [] Diarrhea   [] Vomiting  [x] Gastroesophageal reflux/heartburn   [] Difficulty swallowing. Genitourinary:  [] Chronic kidney disease   [] Difficult urination  [] Frequent urination   [] Blood in urine Skin:  [] Rashes   [] Ulcers  Psychological:  [] History of anxiety   []  History of major depression.  Physical Examination  Vitals:   03/08/21 0927  BP: 126/70  Pulse: (!) 50  Resp: 16  Weight: 211 lb 9.6 oz (96 kg)   Body mass index is 29.93 kg/m. Gen: WD/WN, NAD Head: [place]/AT, No temporalis wasting.  Ear/Nose/Throat: Hearing grossly intact, nares w/o erythema or drainage Eyes: PER, EOMI, sclera nonicteric.  Neck: Supple, no large masses.   Pulmonary:  Good air movement, no audible wheezing bilaterally, no use of accessory muscles.  Cardiac: RRR, no JVD Vascular:  Vessel Right Left  Radial Palpable Palpable  PT Palpable Palpable  DP Palpable Palpable  Gastrointestinal: Non-distended. No guarding/no peritoneal signs.  Musculoskeletal: M/S 5/5 throughout.  No deformity or atrophy.  Neurologic: CN 2-12 intact. Symmetrical.  Speech is fluent. Motor exam as listed above. Psychiatric: Judgment intact, Mood & affect appropriate for pt's clinical situation. Dermatologic: No rashes or ulcers noted.  No changes consistent with cellulitis.  CBC Lab Results  Component  Value Date   WBC 7.2 11/04/2020   HGB 14.3 11/04/2020   HCT 41.4 11/04/2020   MCV 96.1 11/04/2020   PLT 146 (L) 11/04/2020    BMET    Component Value Date/Time   NA 139 11/04/2020 0322   NA 139 04/01/2014 2223   K 3.9 11/04/2020 0322   K 4.1 04/01/2014 2223   CL 103 11/04/2020 0322   CL 107 04/01/2014 2223   CO2 28 11/04/2020 0322   CO2 25 04/01/2014 2223   GLUCOSE 100 (H) 11/04/2020 0322   GLUCOSE 102 (H) 04/01/2014 2223   BUN 16 11/04/2020 0322   BUN 14 04/01/2014 2223   CREATININE 0.94 11/04/2020 0322   CREATININE 1.03 04/01/2014  2223   CALCIUM 9.2 11/04/2020 0322   CALCIUM 8.8 04/01/2014 2223   GFRNONAA >60 11/04/2020 0322   GFRNONAA >60 04/01/2014 2223   GFRAA >60 04/01/2014 2223   CrCl cannot be calculated (Patient's most recent lab result is older than the maximum 21 days allowed.).  COAG No results found for: INR, PROTIME  Radiology MR BRAIN WO CONTRAST  Result Date: 02/15/2021 CLINICAL DATA:  Parkinson's disease.  Known injury. EXAM: MRI HEAD WITHOUT CONTRAST TECHNIQUE: Multiplanar, multiecho pulse sequences of the brain and surrounding structures were obtained without intravenous contrast. COMPARISON:  MRI 04/18/2014. FINDINGS: Brain: No acute infarction, hemorrhage, hydrocephalus, extra-axial collection or mass lesion. Small remote cortical infarct in the right parietal lobe with surrounding gliosis. Small remote lacunar infarct in the right caudate. Mild for age scattered T2/FLAIR hyperintensities within the white matter, compatible with chronic microvascular ischemic disease. Small right para hippocampal cyst. Vascular: Major arterial flow voids are maintained at the skull base. Skull and upper cervical spine: Normal marrow signal. Sinuses/Orbits: Evidence of prior endoscopic sinus surgery. Mild right sphenoid sinus mucosal thickening. No acute orbital abnormality. Other: No mastoid effusions. IMPRESSION: 1. No evidence of acute intracranial abnormality. 2. Remote  right parietal and right caudate infarcts. 3. Mild chronic microvascular ischemic disease. Electronically Signed   By: Feliberto Harts MD   On: 02/15/2021 15:41   VAS [place] AAA DUPLEX  Result Date: 03/08/2021 ABDOMINAL AORTA STUDY Patient Name:  Alan Dennis  Date of Exam:   03/08/2021 Medical Rec #: [id number]            Accession #:    [id number] Date of Birth: 03/07/1941            Patient Gender: M Patient Age:   80Y Exam Location:  [facility] Procedure:      VAS [place] AAA DUPLEX Referring Phys: [id number] Latina Craver [facility]anna Shavers --------------------------------------------------------------------------------  Indications: Surgery date Open AAA repair 2005. rt CIA dilitation Limitations: Air/bowel gas and obesity.  Performing Technologist: Salvadore Farber RVT  Examination Guidelines: A complete evaluation includes B-mode imaging, spectral Doppler, color Doppler, and power Doppler as needed of all accessible portions of each vessel. Bilateral testing is considered an integral part of a complete examination. Limited examinations for reoccurring indications may be performed as noted.  Abdominal Aorta Findings: +-----------+-------+----------+----------+--------+--------+---------------+ Location   AP (cm)Trans (cm)PSV (cm/s)WaveformThrombusComments        +-----------+-------+----------+----------+--------+--------+---------------+ Proximal   2.21   2.49                                                +-----------+-------+----------+----------+--------+--------+---------------+ Mid        2.44   2.12      88        biphasic                        +-----------+-------+----------+----------+--------+--------+---------------+ Distal     1.96   1.93                                                +-----------+-------+----------+----------+--------+--------+---------------+ RT CIA Prox1.8    1.9       92        biphasic  tortuous vessel  +-----------+-------+----------+----------+--------+--------+---------------+ LT CIA Prox1.2    1.2       55        biphasic                        +-----------+-------+----------+----------+--------+--------+---------------+  Summary: Abdominal Aorta: No evidence of an abdominal aortic aneurysm was visualized. There is evidence of abnormal dilation of the Right Common Iliac artery. The largest aortic measurement is 2.5 cm. Open repair of AAA 2005. No previous exam available for comparison.  *See table(s) above for measurements and observations.  Electronically signed by Levora Dredge MD on 03/08/2021 at 5:14:49 PM.    Final      Assessment/Plan 1. Abdominal aortic aneurysm (AAA) without rupture (HCC) No surgery or intervention at this time. The patient has an asymptomatic abdominal aortic aneurysm that is less than 4 cm in maximal diameter.  I have discussed the natural history of abdominal aortic aneurysm and the small risk of rupture for aneurysm less than 5 cm in size.  However, as these small aneurysms tend to enlarge over time, continued surveillance with ultrasound or CT scan is mandatory.  I have also discussed optimizing medical management with hypertension and lipid control and the importance of abstinence from tobacco.  The patient is also encouraged to exercise a minimum of 30 minutes 4 times a week.  Should the patient develop new onset abdominal or back pain or signs of peripheral embolization they are instructed to seek medical attention immediately and to alert the physician providing care that they have an aneurysm.  The patient voices their understanding. The patient will return in 24 months with an aortic duplex.   2. Bilateral carotid artery stenosis Recommend:  Given the patient's asymptomatic subcritical stenosis no further invasive testing or surgery at this time.  Duplex ultrasound shows 50% stenosis bilaterally.  Continue antiplatelet therapy as  prescribed Continue management of CAD, HTN and Hyperlipidemia Healthy heart diet,  encouraged exercise at least 4 times per week Follow up in 24 months with duplex ultrasound and physical exam    3. Essential hypertension Continue antihypertensive medications as already ordered, these medications have been reviewed and there are no changes at this time.   4. Gastroesophageal reflux disease, unspecified whether esophagitis present Continue PPI as already ordered, this medication has been reviewed and there are no changes at this time.  Avoidence of caffeine and alcohol  Moderate elevation of the head of the bed    5. Hyperlipidemia, mixed Continue statin as ordered and reviewed, no changes at this time     Levora Dredge, MD  03/12/2021 11:09 AM

## 2021-10-24 DIAGNOSIS — I723 Aneurysm of iliac artery: Secondary | ICD-10-CM | POA: Insufficient documentation

## 2022-05-27 DIAGNOSIS — M1A0720 Idiopathic chronic gout, left ankle and foot, without tophus (tophi): Secondary | ICD-10-CM | POA: Insufficient documentation

## 2022-07-15 ENCOUNTER — Encounter (INDEPENDENT_AMBULATORY_CARE_PROVIDER_SITE_OTHER): Payer: Self-pay

## 2023-03-04 ENCOUNTER — Other Ambulatory Visit (INDEPENDENT_AMBULATORY_CARE_PROVIDER_SITE_OTHER): Payer: Self-pay | Admitting: Vascular Surgery

## 2023-03-04 DIAGNOSIS — I714 Abdominal aortic aneurysm, without rupture, unspecified: Secondary | ICD-10-CM

## 2023-03-04 DIAGNOSIS — I6523 Occlusion and stenosis of bilateral carotid arteries: Secondary | ICD-10-CM

## 2023-03-06 ENCOUNTER — Other Ambulatory Visit (INDEPENDENT_AMBULATORY_CARE_PROVIDER_SITE_OTHER): Payer: Medicare PPO

## 2023-03-06 ENCOUNTER — Ambulatory Visit (INDEPENDENT_AMBULATORY_CARE_PROVIDER_SITE_OTHER): Payer: Medicare PPO | Admitting: Vascular Surgery

## 2023-03-06 ENCOUNTER — Encounter (INDEPENDENT_AMBULATORY_CARE_PROVIDER_SITE_OTHER): Payer: Medicare PPO

## 2023-04-21 DIAGNOSIS — M1711 Unilateral primary osteoarthritis, right knee: Principal | ICD-10-CM | POA: Insufficient documentation

## 2023-04-24 ENCOUNTER — Encounter (INDEPENDENT_AMBULATORY_CARE_PROVIDER_SITE_OTHER): Payer: Self-pay | Admitting: Vascular Surgery

## 2023-04-24 ENCOUNTER — Ambulatory Visit (INDEPENDENT_AMBULATORY_CARE_PROVIDER_SITE_OTHER): Payer: Medicare PPO

## 2023-04-24 ENCOUNTER — Ambulatory Visit (INDEPENDENT_AMBULATORY_CARE_PROVIDER_SITE_OTHER): Payer: Medicare PPO | Admitting: Vascular Surgery

## 2023-04-24 VITALS — BP 130/75 | HR 83 | Resp 18 | Ht 70.5 in | Wt 189.0 lb

## 2023-04-24 DIAGNOSIS — E782 Mixed hyperlipidemia: Secondary | ICD-10-CM | POA: Diagnosis not present

## 2023-04-24 DIAGNOSIS — I7143 Infrarenal abdominal aortic aneurysm, without rupture: Secondary | ICD-10-CM

## 2023-04-24 DIAGNOSIS — I6523 Occlusion and stenosis of bilateral carotid arteries: Secondary | ICD-10-CM

## 2023-04-24 DIAGNOSIS — I714 Abdominal aortic aneurysm, without rupture, unspecified: Secondary | ICD-10-CM

## 2023-04-24 DIAGNOSIS — I10 Essential (primary) hypertension: Secondary | ICD-10-CM

## 2023-04-24 NOTE — Progress Notes (Signed)
MRN : [id number]  Alan Dennis is a 82 y.o. (11/09/1940) male who presents with chief complaint of check circulation.  History of Present Illness:   The patient returns to the office for surveillance of a known abdominal aortic aneurysm. He is s/p open repair in 2005.  Patient denies abdominal pain or back pain, no other abdominal complaints. No changes suggesting embolic episodes.    He notes his Parkinson's Disease has progressed significantly especially with strength (such as standing from a chair) and speech.   Patient denies amaurosis fugax or TIA symptoms. There is no history of claudication or rest pain symptoms of the lower extremities. The patient denies angina or shortness of breath.    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   Duplex ultrasound of the carotid arteries done today shows RICA 40-59% and LICA 1-39% stenosis.  Current Meds  Medication Sig   allopurinol (ZYLOPRIM) 100 MG tablet Take 200 mg by mouth daily.   aspirin EC 81 MG tablet Take 2 tablets (162 mg total) by mouth daily.   carbidopa-levodopa (SINEMET IR) 25-100 MG tablet Take 1 tablet by mouth daily.   cefUROXime (CEFTIN) 250 MG tablet Take 250 mg by mouth 2 (two) times daily with a meal.   cholecalciferol (VITAMIN D3) 25 MCG (1000 UNIT) tablet Take 2,000 Units by mouth daily.   cyanocobalamin (VITAMIN B12) 1000 MCG tablet Take 1,000 mcg by mouth daily.   hydrOXYzine (ATARAX) 25 MG tablet Take 25 mg by mouth 1 day or 1 dose.   latanoprost (XALATAN) 0.005 % ophthalmic solution Place 1 drop into both eyes at bedtime.    traMADol (ULTRAM) 50 MG tablet Take 50 mg by mouth as needed.   Vitamin D, Ergocalciferol, (DRISDOL) 1.25 MG (50000 UNIT) CAPS capsule Take 50,000 Units by mouth once a week.    Past Medical History:  Diagnosis Date   Arthritis     GERD (gastroesophageal reflux disease)    Gout    History of hiatal hernia    2018   Hypertension    Stroke (HCC) 2015   TIA - no deficits    Past Surgical History:  Procedure Laterality Date   ABDOMINAL AORTIC ANEURYSM REPAIR  2005   BACK SURGERY     spur removal, (also, major back surg in 1970s)   ESOPHAGOGASTRODUODENOSCOPY (EGD) WITH PROPOFOL N/A 05/08/2017   Procedure: ESOPHAGOGASTRODUODENOSCOPY (EGD) WITH PROPOFOL;  Surgeon: Toney Reil, MD;  Location: [facility];  Service: Gastroenterology;  Laterality: N/A;   EYE SURGERY     bilateral   GANGLION CYST EXCISION Left 05/22/2017   Procedure: REMOVAL GANGLION OF WRIST;  Surgeon: Kennedy Bucker, MD;  Location: [facility] OR[facility];  Service: Orthopedics;  Laterality: Left;   IMAGE GUIDED SINUS SURGERY N/A 02/20/2017   Procedure: IMAGE GUIDED SINUS SURGERY;  Surgeon: Vernie Murders, MD;  Location: [facility];  Service: ENT;  Laterality: N/A;  need disk GAVE DISK TO [facility] 5-15 KP   KNEE SURGERY Right    NASAL SINUS SURGERY  several   SHOULDER SURGERY Right    SINUSOTOMY Right 02/20/2017   Procedure: SINUSOTOMY right endoscopic with removal content;  Surgeon: Vernie Murders, MD;  Location: [facility];  Service: ENT;  Laterality: Right;    Social History Social History   Tobacco Use   Smoking status: Former    Current packs/day: 0.00    Types: Cigarettes    Quit date: 1985    Years since quitting: 39.6   Smokeless tobacco: Never  Vaping Use   Vaping status: Never Used  Substance Use Topics   Alcohol use: Yes    Alcohol/week: 14.0 standard drinks of alcohol    Types: 14 Glasses of wine per week   Drug use: No    Family History Family History  Problem Relation Age of Onset   Cancer Father    Hypertension Sister    Diabetes Sister    Hypertension Brother    Diabetes Brother    Parkinson's disease Brother     Allergies  Allergen Reactions   Rosuvastatin     Other reaction(s): Abdominal Pain Patient  stated it made him feel jittery    Atorvastatin     Other reaction(s): Abdominal Pain Patient stated this made him jittery    Bactrim [Sulfamethoxazole-Trimethoprim] Hives   Tetracycline Rash   Lisinopril Other (See Comments)    Leg muscles ache   Sulfa Antibiotics Hives and Rash   Tetracyclines & Related Rash   Zithromax [Azithromycin] Hives    Cough/ unsure     REVIEW OF SYSTEMS (Negative unless checked)  Constitutional: [] Weight loss  [] Fever  [] Chills Cardiac: [] Chest pain   [] Chest pressure   [] Palpitations   [] Shortness of breath when laying flat   [] Shortness of breath with exertion. Vascular:  [x] Pain in legs with walking   [] Pain in legs at rest  [] History of DVT   [] Phlebitis   [] Swelling in legs   [] Varicose veins   [] Non-healing ulcers Pulmonary:   [] Uses home oxygen   [] Productive cough   [] Hemoptysis   [] Wheeze  [] COPD   [] Asthma Neurologic:  [] Dizziness   [] Seizures   [] History of stroke   [] History of TIA  [] Aphasia   [] Vissual changes   [] Weakness or numbness in arm   [] Weakness or numbness in leg Musculoskeletal:   [] Joint swelling   [] Joint pain   [] Low back pain Hematologic:  [] Easy bruising  [] Easy bleeding   [] Hypercoagulable state   [] Anemic Gastrointestinal:  [] Diarrhea   [] Vomiting  [] Gastroesophageal reflux/heartburn   [] Difficulty swallowing. Genitourinary:  [] Chronic kidney disease   [] Difficult urination  [] Frequent urination   [] Blood in urine Skin:  [] Rashes   [] Ulcers  Psychological:  [] History of anxiety   []  History of major depression.  Physical Examination  Vitals:   04/24/23 1018  BP: 130/75  Pulse: 83  Resp: 18  Weight: 189 lb (85.7 kg)  Height: 5' 10.5" (1.791 m)   Body mass index is 26.74 kg/m. Gen: WD/WN, NAD Head: [place]/AT, No temporalis wasting.  Ear/Nose/Throat: Hearing grossly intact, nares w/o erythema or drainage Eyes: PER, EOMI, sclera nonicteric.  Neck: Supple, no masses.  No bruit or JVD.  Pulmonary:  Good air movement, no  audible wheezing, no use of accessory muscles.  Cardiac: RRR, normal S1, S2, no Murmurs. Vascular:   right carotid bruit noted Vessel Right Left  Radial Palpable Palpable  PT Palpable Palpable  DP Palpable  Palpable  Gastrointestinal: soft, non-distended. No guarding/no peritoneal signs.  Musculoskeletal: M/S 5/5 throughout.  No  visible deformity.  Neurologic: CN 2-12 intact. Pain and light touch intact in extremities.  Symmetrical.  Speech is fluent. Motor exam as listed above. Psychiatric: Judgment intact, Mood & affect appropriate for pt's clinical situation. Dermatologic: No rashes or ulcers noted.  No changes consistent with cellulitis.   CBC Lab Results  Component Value Date   WBC 7.2 11/04/2020   HGB 14.3 11/04/2020   HCT 41.4 11/04/2020   MCV 96.1 11/04/2020   PLT 146 (L) 11/04/2020    BMET    Component Value Date/Time   NA 139 11/04/2020 0322   NA 139 04/01/2014 2223   K 3.9 11/04/2020 0322   K 4.1 04/01/2014 2223   CL 103 11/04/2020 0322   CL 107 04/01/2014 2223   CO2 28 11/04/2020 0322   CO2 25 04/01/2014 2223   GLUCOSE 100 (H) 11/04/2020 0322   GLUCOSE 102 (H) 04/01/2014 2223   BUN 16 11/04/2020 0322   BUN 14 04/01/2014 2223   CREATININE 0.94 11/04/2020 0322   CREATININE 1.03 04/01/2014 2223   CALCIUM 9.2 11/04/2020 0322   CALCIUM 8.8 04/01/2014 2223   GFRNONAA >60 11/04/2020 0322   GFRNONAA >60 04/01/2014 2223   GFRAA >60 04/01/2014 2223   CrCl cannot be calculated (Patient's most recent lab result is older than the maximum 21 days allowed.).  COAG No results found for: "INR", "PROTIME"  Radiology No results found.   Assessment/Plan 1. Infrarenal abdominal aortic aneurysm (AAA) without rupture (HCC) Recommend: No surgery or intervention is indicated at this time.  The patient has an asymptomatic abdominal aortic aneurysm that is less than 4 cm in maximal diameter.    I have reviewed the natural history of abdominal aortic aneurysm and the  small risk of rupture for aneurysm less than 5 cm in size.  However, as these small aneurysms tend to enlarge over time, continued surveillance with ultrasound or CT scan is mandatory.   I have also discussed optimizing medical management with hypertension and lipid control and the importance of abstinence from tobacco.  The patient is also encouraged to exercise a minimum of 30 minutes 4 times a week.   Should the patient develop new onset abdominal or back pain or signs of peripheral embolization they are instructed to seek medical attention immediately and to alert the physician providing care that they have an aneurysm.   The patient voices their understanding.  The patient will return in 24 months with an aortic duplex. - VAS US AORTA/IVC/ILIACS; Future  2. Bilateral carotid artery stenosis Recommend:   Given the patient's asymptomatic subcritical stenosis no further invasive testing or surgery at this time.   Duplex ultrasound shows 50% stenosis bilaterally.   Continue antiplatelet therapy as prescribed Continue management of CAD, HTN and Hyperlipidemia Healthy heart diet,  encouraged exercise at least 4 times per week Follow up in 24 months with duplex ultrasound and physical exam   - VAS US CAROTID; Future  3. Essential hypertension Continue antihypertensive medications as already ordered, these medications have been reviewed and there are no changes at this time.  4. Hyperlipidemia, mixed Continue statin as ordered and reviewed, no changes at this time    Levora Dredge, MD  04/24/2023 10:24 AM

## 2023-04-25 ENCOUNTER — Encounter (INDEPENDENT_AMBULATORY_CARE_PROVIDER_SITE_OTHER): Payer: Self-pay | Admitting: Vascular Surgery

## 2023-08-06 ENCOUNTER — Other Ambulatory Visit: Payer: Self-pay | Admitting: Internal Medicine

## 2023-08-06 DIAGNOSIS — R600 Localized edema: Secondary | ICD-10-CM

## 2023-08-07 ENCOUNTER — Ambulatory Visit
Admission: RE | Admit: 2023-08-07 | Discharge: 2023-08-07 | Disposition: A | Discharge status: Home / Self Care | Payer: Medicare PPO | Source: Ambulatory Visit | Attending: Internal Medicine | Admitting: Internal Medicine

## 2023-08-07 DIAGNOSIS — R600 Localized edema: Secondary | ICD-10-CM | POA: Insufficient documentation

## 2023-09-14 NOTE — Discharge Instructions (Addendum)
 Instructions after Total Knee Replacement   Alan Dennis P. Angie Fava., M.D.    Dept. of Orthopaedics & Sports Medicine [facility] [address] [place], [place]  [address]  Phone: [phone]   Fax: [phone]       [email or web address]       DIET: Drink plenty of non-alcoholic fluids. Resume your normal diet. Include foods high in fiber.  ACTIVITY:  You may use crutches or a walker with weight-bearing as tolerated, unless instructed otherwise. You may be weaned off of the walker or crutches by your Physical Therapist.  Do NOT place pillows under the knee. Anything placed under the knee could limit your ability to straighten the knee.   Continue doing gentle exercises. Exercising will reduce the pain and swelling, increase motion, and prevent muscle weakness.   Please continue to use the TED compression stockings for 6 weeks. You may remove the stockings at night, but should reapply them in the morning. Do not drive or operate any equipment until instructed.  WOUND CARE:  Continue to use the PolarCare or ice packs periodically to reduce pain and swelling. You may bathe or shower after the staples are removed at the first office visit following surgery.  MEDICATIONS: You may resume your regular medications. Please take the pain medication as prescribed on the medication. Do not take pain medication on an empty stomach. You have been given a prescription for a blood thinner (Lovenox or Coumadin). Please take the medication as instructed. (NOTE: After completing a 2 week course of Lovenox, take one Enteric-coated aspirin once a day. This along with elevation will help reduce the possibility of phlebitis in your operated leg.) Do not drive or drink alcoholic beverages when taking pain medications.  CALL THE OFFICE FOR: Temperature above 101 degrees Excessive bleeding or drainage on the dressing. Excessive swelling, coldness, or paleness of the toes. Persistent nausea and  vomiting.  FOLLOW-UP:  You should have an appointment to return to the office in 10-14 days after surgery. Arrangements have been made for continuation of Physical Therapy (either home therapy or outpatient therapy).     [facility] Department Directory         [email or web address]       [email or web address]          Cardiology  Appointments: [place] - [phone]  Endocrinology  Appointments: [place] [phone] [place] - [phone]  Gastroenterology  Appointments: [place] [phone] [place] - [phone]        General Surgery   Appointments: [facility]  Internal Medicine/Family Medicine  Appointments: [facility] [place] - [phone] [place] - [phone]  Metabolic and Weigh Loss Surgery  Appointments: [facility]        Neurology  Appointments: [place] [phone] [place] - [phone]  Neurosurgery  Appointments: [place]  Obstetrics & Gynecology  Appointments: [place] [phone] [place] - [phone]        Pediatrics  Appointments: [place] [phone] [place] - [phone]  Physiatry  Appointments: [place] [phone]  Physical Therapy  Appointments: [place] - [phone]        Podiatry  Appointments: [place] [phone] [place] - [phone]  Pulmonology  Appointments: [place]  Rheumatology  Appointments: [place] [phone]        [place] Location: [facility]  [address] [place], [place]  [address]  [place] Location: [facility] [address] [place], [place]  [address]  [place] Location: [facility] [address]  can come to see you

## 2023-09-26 ENCOUNTER — Encounter
Admission: RE | Admit: 2023-09-26 | Discharge: 2023-09-26 | Disposition: A | Discharge status: Home / Self Care | Payer: Medicare PPO | Source: Ambulatory Visit | Attending: Orthopedic Surgery | Admitting: Orthopedic Surgery

## 2023-09-26 VITALS — BP 132/70 | HR 85 | Resp 12 | Ht 70.5 in | Wt 183.4 lb

## 2023-09-26 DIAGNOSIS — Z01812 Encounter for preprocedural laboratory examination: Secondary | ICD-10-CM | POA: Insufficient documentation

## 2023-09-26 DIAGNOSIS — M1711 Unilateral primary osteoarthritis, right knee: Secondary | ICD-10-CM | POA: Insufficient documentation

## 2023-09-26 DIAGNOSIS — Z01818 Encounter for other preprocedural examination: Secondary | ICD-10-CM

## 2023-09-26 HISTORY — DX: Vitamin D deficiency, unspecified: E55.9

## 2023-09-26 HISTORY — DX: Occlusion and stenosis of bilateral carotid arteries: I65.23

## 2023-09-26 HISTORY — DX: Peripheral vascular disease, unspecified: I73.9

## 2023-09-26 HISTORY — DX: Unilateral primary osteoarthritis, right knee: M17.11

## 2023-09-26 HISTORY — DX: Panic disorder (episodic paroxysmal anxiety): F41.0

## 2023-09-26 HISTORY — DX: Aneurysm of iliac artery: I72.3

## 2023-09-26 HISTORY — DX: Parkinson's disease without dyskinesia, without mention of fluctuations: G20.A1

## 2023-09-26 HISTORY — DX: Unspecified glaucoma: H40.9

## 2023-09-26 HISTORY — DX: Ventricular premature depolarization: I49.3

## 2023-09-26 HISTORY — DX: Sciatica, unspecified side: M54.30

## 2023-09-26 HISTORY — DX: Hyperlipidemia, unspecified: E78.5

## 2023-09-26 HISTORY — DX: Anemia, unspecified: D64.9

## 2023-09-26 HISTORY — DX: Abdominal aortic aneurysm, without rupture, unspecified: I71.40

## 2023-09-26 LAB — SURGICAL PCR SCREEN
MRSA, PCR: NEGATIVE
Staphylococcus aureus: NEGATIVE

## 2023-09-26 LAB — URINALYSIS, ROUTINE W REFLEX MICROSCOPIC
Bilirubin Urine: NEGATIVE
Glucose, UA: NEGATIVE mg/dL
Hgb urine dipstick: NEGATIVE
Ketones, ur: NEGATIVE mg/dL
Leukocytes,Ua: NEGATIVE
Nitrite: NEGATIVE
Protein, ur: NEGATIVE mg/dL
Specific Gravity, Urine: 1.025 (ref 1.005–1.030)
pH: 5 (ref 5.0–8.0)

## 2023-09-26 LAB — SEDIMENTATION RATE: Sed Rate: 5 mm/h (ref 0–20)

## 2023-09-26 NOTE — Patient Instructions (Addendum)
 Your procedure is scheduled on:10-06-23 Monday Report to the Registration Desk on the 1st floor of the [facility].Then proceed to the 2nd floor Surgery Desk To find out your arrival time, please call [phone] between 1PM - 3PM on:10-03-23 Friday If your arrival time is 6:00 am, do not arrive before that time as the [facility] entrance doors do not open until 6:00 am.  REMEMBER: Instructions that are not followed completely may result in serious medical risk, up to and including death; or upon the discretion of your surgeon and anesthesiologist your surgery may need to be rescheduled.  Do not eat food after midnight the night before surgery.  No gum chewing or hard candies.  You may however, drink CLEAR liquids up to 2 hours before you are scheduled to arrive for your surgery. Do not drink anything within 2 hours of your scheduled arrival time.  Clear liquids include: - water  - apple juice without pulp - gatorade (not RED colors) - black coffee or tea (Do NOT add milk or creamers to the coffee or tea) Do NOT drink anything that is not on this list.  In addition, your doctor has ordered for you to drink the provided:  Ensure Pre-Surgery Clear Carbohydrate Drink  Drinking this carbohydrate drink up to two hours before surgery helps to reduce insulin resistance and improve patient outcomes. Please complete drinking 2 hours before scheduled arrival time.  One week prior to surgery:Starting 09-29-23 Stop Anti-inflammatories (NSAIDS) such as Advil, Aleve, Ibuprofen, Motrin, Naproxen, Naprosyn and Aspirin  based products such as Excedrin, Goody's Powder, BC Powder. Stop ANY OVER THE COUNTER supplements until after surgery (Vitamin D3, B12, Ferrous Sulfate )  You may however, continue to take Tylenol  if needed for pain up until the day of surgery.  Continue taking all of your other prescription medications up until the day of surgery.  ON THE DAY OF SURGERY ONLY TAKE THESE MEDICATIONS  WITH SIPS OF WATER: -carbidopa -levodopa  (SINEMET  IR)  -allopurinol  (ZYLOPRIM )   Continue your 81 mg Aspirin  up until the day prior to surgery-Do NOT take the morning of surgery  No Alcohol for 24 hours before or after surgery.  No Smoking including e-cigarettes for 24 hours before surgery.  No chewable tobacco products for at least 6 hours before surgery.  No nicotine patches on the day of surgery.  Do not use any recreational drugs for at least a week (preferably 2 weeks) before your surgery.  Please be advised that the combination of cocaine and anesthesia may have negative outcomes, up to and including death. If you test positive for cocaine, your surgery will be cancelled.  On the morning of surgery brush your teeth with toothpaste and water, you may rinse your mouth with mouthwash if you wish. Do not swallow any toothpaste or mouthwash.  Use CHG Soap as directed on instruction sheet.  Do not wear jewelry, make-up, hairpins, clips or nail polish.  For welded (permanent) jewelry: bracelets, anklets, waist bands, etc.  Please have this removed prior to surgery.  If it is not removed, there is a chance that hospital personnel will need to cut it off on the day of surgery.  Do not wear lotions, powders, or perfumes.   Do not shave body hair from the neck down 48 hours before surgery.  Contact lenses, hearing aids and dentures may not be worn into surgery.  Do not bring valuables to the hospital. [facility] is not responsible for any missing/lost belongings or valuables.   Notify  your doctor if there is any change in your medical condition (cold, fever, infection).  Wear comfortable clothing (specific to your surgery type) to the hospital.  After surgery, you can help prevent lung complications by doing breathing exercises.  Take deep breaths and cough every 1-2 hours. Your doctor may order a device called an Incentive Spirometer to help you take deep breaths. When coughing  or sneezing, hold a pillow firmly against your incision with both hands. This is called "splinting." Doing this helps protect your incision. It also decreases belly discomfort.  If you are being admitted to the hospital overnight, leave your suitcase in the car. After surgery it may be brought to your room.  In case of increased patient census, it may be necessary for you, the patient, to continue your postoperative care in the Same Day Surgery department.  If you are being discharged the day of surgery, you will not be allowed to drive home. You will need a responsible individual to drive you home and stay with you for 24 hours after surgery.   If you are taking public transportation, you will need to have a responsible individual with you.  Please call the Pre-admissions Testing Dept. at [phone] if you have any questions about these instructions.  Surgery Visitation Policy:  Patients having surgery or a procedure may have two visitors.  Children under the age of 9 must have an adult with them who is not the patient.  Inpatient Visitation:    Visiting hours are 7 a.m. to 8 p.m. Up to four visitors are allowed at one time in a patient room. The visitors may rotate out with other people during the day.  One visitor age 51 or older may stay with the patient overnight and must be in the room by 8 p.m.    Pre-operative 5 CHG Bath Instructions   You can play a key role in reducing the risk of infection after surgery. Your skin needs to be as free of germs as possible. You can reduce the number of germs on your skin by washing with CHG (chlorhexidine  gluconate) soap before surgery. CHG is an antiseptic soap that kills germs and continues to kill germs even after washing.   DO NOT use if you have an allergy to chlorhexidine /CHG or antibacterial soaps. If your skin becomes reddened or irritated, stop using the CHG and notify one of our RNs at [phone].   Please shower with the CHG  soap starting 4 days before surgery using the following schedule:     Please keep in mind the following:  DO NOT shave, including legs and underarms, starting the day of your first shower.   You may shave your face at any point before/day of surgery.  Place clean sheets on your bed the day you start using CHG soap. Use a clean washcloth (not used since being washed) for each shower. DO NOT sleep with pets once you start using the CHG.   CHG Shower Instructions:  If you choose to wash your hair and private area, wash first with your normal shampoo/soap.  After you use shampoo/soap, rinse your hair and body thoroughly to remove shampoo/soap residue.  Turn the water OFF and apply about 3 tablespoons (45 ml) of CHG soap to a CLEAN washcloth.  Apply CHG soap ONLY FROM YOUR NECK DOWN TO YOUR TOES (washing for 3-5 minutes)  DO NOT use CHG soap on face, private areas, open wounds, or sores.  Pay special attention to the  area where your surgery is being performed.  If you are having back surgery, having someone wash your back for you may be helpful. Wait 2 minutes after CHG soap is applied, then you may rinse off the CHG soap.  Pat dry with a clean towel  Put on clean clothes/pajamas   If you choose to wear lotion, please use ONLY the CHG-compatible lotions on the back of this paper.     Additional instructions for the day of surgery: DO NOT APPLY any lotions, deodorants, cologne, or perfumes.   Put on clean/comfortable clothes.  Brush your teeth.  Ask your nurse before applying any prescription medications to the skin.      CHG Compatible Lotions   Aveeno Moisturizing lotion  Cetaphil Moisturizing Cream  Cetaphil Moisturizing Lotion  Clairol Herbal Essence Moisturizing Lotion, Dry Skin  Clairol Herbal Essence Moisturizing Lotion, Extra Dry Skin  Clairol Herbal Essence Moisturizing Lotion, Normal Skin  Curel Age Defying Therapeutic Moisturizing Lotion with Alpha Hydroxy  Curel  Extreme Care Body Lotion  Curel Soothing Hands Moisturizing Hand Lotion  Curel Therapeutic Moisturizing Cream, Fragrance-Free  Curel Therapeutic Moisturizing Lotion, Fragrance-Free  Curel Therapeutic Moisturizing Lotion, Original Formula  Eucerin Daily Replenishing Lotion  Eucerin Dry Skin Therapy Plus Alpha Hydroxy Crme  Eucerin Dry Skin Therapy Plus Alpha Hydroxy Lotion  Eucerin Original Crme  Eucerin Original Lotion  Eucerin Plus Crme Eucerin Plus Lotion  Eucerin TriLipid Replenishing Lotion  Keri Anti-Bacterial Hand Lotion  Keri Deep Conditioning Original Lotion Dry Skin Formula Softly Scented  Keri Deep Conditioning Original Lotion, Fragrance Free Sensitive Skin Formula  Keri Lotion Fast Absorbing Fragrance Free Sensitive Skin Formula  Keri Lotion Fast Absorbing Softly Scented Dry Skin Formula  Keri Original Lotion  Keri Skin Renewal Lotion Keri Silky Smooth Lotion  Keri Silky Smooth Sensitive Skin Lotion  Nivea Body Creamy Conditioning Oil  Nivea Body Extra Enriched Lotion  Nivea Body Original Lotion  Nivea Body Sheer Moisturizing Lotion Nivea Crme  Nivea Skin Firming Lotion  NutraDerm 30 Skin Lotion  NutraDerm Skin Lotion  NutraDerm Therapeutic Skin Cream  NutraDerm Therapeutic Skin Lotion  ProShield Protective Hand Cream  Provon moisturizing lotion  How to Use an Incentive Spirometer An incentive spirometer is a tool that measures how well you are filling your lungs with each breath. Learning to take long, deep breaths using this tool can help you keep your lungs clear and active. This may help to reverse or lessen your chance of developing breathing (pulmonary) problems, especially infection. You may be asked to use a spirometer: After a surgery. If you have a lung problem or a history of smoking. After a long period of time when you have been unable to move or be active. If the spirometer includes an indicator to show the highest number that you have reached, your  health care provider or respiratory therapist will help you set a goal. Keep a log of your progress as told by your health care provider. What are the risks? Breathing too quickly may cause dizziness or cause you to pass out. Take your time so you do not get dizzy or light-headed. If you are in pain, you may need to take pain medicine before doing incentive spirometry. It is harder to take a deep breath if you are having pain. How to use your incentive spirometer  Sit up on the edge of your bed or on a chair. Hold the incentive spirometer so that it is in an upright position. Before you use  the spirometer, breathe out normally. Place the mouthpiece in your mouth. Make sure your lips are closed tightly around it. Breathe in slowly and as deeply as you can through your mouth, causing the piston or the ball to rise toward the top of the chamber. Hold your breath for 3-5 seconds, or for as long as possible. If the spirometer includes a coach indicator, use this to guide you in breathing. Slow down your breathing if the indicator goes above the marked areas. Remove the mouthpiece from your mouth and breathe out normally. The piston or ball will return to the bottom of the chamber. Rest for a few seconds, then repeat the steps 10 or more times. Take your time and take a few normal breaths between deep breaths so that you do not get dizzy or light-headed. Do this every 1-2 hours when you are awake. If the spirometer includes a goal marker to show the highest number you have reached (best effort), use this as a goal to work toward during each repetition. After each set of 10 deep breaths, cough a few times. This will help to make sure that your lungs are clear. If you have an incision on your chest or abdomen from surgery, place a pillow or a rolled-up towel firmly against the incision when you cough. This can help to reduce pain while taking deep breaths and coughing. General tips When you are able to  get out of bed: Walk around often. Continue to take deep breaths and cough in order to clear your lungs. Keep using the incentive spirometer until your health care provider says it is okay to stop using it. If you have been in the hospital, you may be told to keep using the spirometer at home. Contact a health care provider if: You are having difficulty using the spirometer. You have trouble using the spirometer as often as instructed. Your pain medicine is not giving enough relief for you to use the spirometer as told. You have a fever. Get help right away if: You develop shortness of breath. You develop a cough with bloody mucus from the lungs. You have fluid or blood coming from an incision site after you cough. Summary An incentive spirometer is a tool that can help you learn to take long, deep breaths to keep your lungs clear and active. You may be asked to use a spirometer after a surgery, if you have a lung problem or a history of smoking, or if you have been inactive for a long period of time. Use your incentive spirometer as instructed every 1-2 hours while you are awake. If you have an incision on your chest or abdomen, place a pillow or a rolled-up towel firmly against your incision when you cough. This will help to reduce pain. Get help right away if you have shortness of breath, you cough up bloody mucus, or blood comes from your incision when you cough. This information is not intended to replace advice given to you by your health care provider. Make sure you discuss any questions you have with your health care provider. Document Revised: 11/22/2019 Document Reviewed: 11/22/2019 Elsevier Patient Education  2024 [employer].

## 2023-09-27 LAB — C-REACTIVE PROTEIN: CRP: 0.6 mg/dL (ref <1.0)

## 2023-10-05 ENCOUNTER — Encounter: Payer: Self-pay | Admitting: Orthopedic Surgery

## 2023-10-05 MED ORDER — DEXAMETHASONE SODIUM PHOSPHATE 10 MG/ML IJ SOLN
8.0000 mg | Freq: Once | INTRAMUSCULAR | Status: AC
  Administered 2023-10-06: 8 mg via INTRAVENOUS

## 2023-10-05 MED ORDER — GABAPENTIN 300 MG PO CAPS
300.0000 mg | ORAL_CAPSULE | Freq: Once | ORAL | Status: AC
  Administered 2023-10-06: 300 mg via ORAL

## 2023-10-05 MED ORDER — CELECOXIB 200 MG PO CAPS
400.0000 mg | ORAL_CAPSULE | Freq: Once | ORAL | Status: AC
  Administered 2023-10-06: 400 mg via ORAL

## 2023-10-05 MED ORDER — CHLORHEXIDINE GLUCONATE 0.12 % MT SOLN
15.0000 mL | Freq: Once | OROMUCOSAL | Status: AC
  Administered 2023-10-06: 15 mL via OROMUCOSAL

## 2023-10-05 MED ORDER — LACTATED RINGERS IV SOLN: INTRAVENOUS | Status: DC

## 2023-10-05 MED ORDER — ORAL CARE MOUTH RINSE: 15.0000 mL | Freq: Once | OROMUCOSAL | Status: AC

## 2023-10-05 MED ORDER — CHLORHEXIDINE GLUCONATE 4 % EX SOLN: 60.0000 mL | Freq: Once | CUTANEOUS | Status: DC

## 2023-10-05 MED ORDER — CEFAZOLIN SODIUM-DEXTROSE 2-4 GM/100ML-% IV SOLN
2.0000 g | INTRAVENOUS | Status: AC
  Administered 2023-10-06: 2 g via INTRAVENOUS

## 2023-10-05 MED ORDER — TRANEXAMIC ACID-NACL 1000-0.7 MG/100ML-% IV SOLN
1000.0000 mg | INTRAVENOUS | Status: AC
  Administered 2023-10-06: 1000 mg via INTRAVENOUS

## 2023-10-05 NOTE — H&P (Signed)
ORTHOPAEDIC HISTORY & PHYSICAL Alan Dennis, [place] - 09/30/2023 10:00 AM EST Formatting of this note is different from the original. NAME: Alan Dennis H&P Date: 09/30/2023 Procedure Date: 10/06/2023  Chief Complaint: right knee pain and giving way  HPI Alan Dennis is a 83 y.o. male who has severe right knee pain. He has had a long history of right knee pain that has persisted for the last few years. 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 Ernest Pine to address any progression of the underlying issue. He states that his symptoms in the past few months have increased in severity. His symptoms include pain that localizes along the medial aspect of the right knee. He does report some intermittent giving way of the knee, but denies any noticeable swelling or locking. He states that this pain is made worse with any weightbearing or prolonged standing. It has begun to affect his ability to perform his ADLs and ambulate long distances. He has failed conservative treatment including oral analgesic medications including Tylenol and tramadol and activity modification. He has requested operative intervention for relief of his DJD symptoms. Of note, patient has a significant history for having Parkinson's which she is receiving Sinemet for. He denies having any cardiac or pulmonary history/issues. Denies any history of DVTs or clots. Patient is not a diabetic.  Of note, the patient and his wife are on the waiting list to move in to [facility].  Social Hx: Patient is retired. He lives at home with his wife. They state that they have a son that lives in [place] that is planning to come up after he has his surgery to help out for a few days. He does report having a few glasses of wine a day. Denies any illicit drug use or tobacco, nicotine or smoking use.  Medications & Allergies Allergies: Allergies Allergen Reactions Crestor  [Rosuvastatin] Muscle Pain Patient stated it made him feel jittery Lipitor [Atorvastatin] Muscle Pain Patient stated this made him jittery Lisinopril Cough Sulfa (Sulfonamide Antibiotics) Rash Burning pain Tetracycline Rash Burning pain  Home Medicines: Current Outpatient Medications on File Prior to Visit Medication Sig Dispense Refill allopurinoL (ZYLOPRIM) 100 MG tablet TAKE 2 TABLETS EVERY TUESDAY, THURSDAY, SATURDAY, AND SUNDAY AND 1 TABLET EVERY MONDAY, WEDNESDAY, AND FRIDAY. DO ALL THIS FOR 90 DAYS. ALTERNATES 200 MG ONE DAY AND 100 MG ONE DAY.. 132 tablet 0 aspirin 81 MG EC tablet Take 81 mg by mouth once daily carbidopa-levodopa (SINEMET) 25-100 mg tablet Take 1 tablet by mouth 2 (two) times daily 180 tablet 3 cholecalciferol (VITAMIN D3) 2,000 unit capsule Take 2,000 Units by mouth once daily cyanocobalamin (VITAMIN B12) 1000 MCG tablet Take 1,000 mcg by mouth once daily ergocalciferol, vitamin D2, 1,250 mcg (50,000 unit) capsule Take 50,000 Units by mouth every 7 (seven) days ferrous sulfate 325 (65 FE) MG EC tablet Take 325 mg by mouth once daily fluocinonide (LIDEX) 0.05 % cream Apply topically 2 (two) times daily FUROsemide (LASIX) 20 MG tablet Take 1 tablet (20 mg total) by mouth once daily 30 tablet 5 hydrOXYzine (ATARAX) 25 MG tablet Take 25 mg by mouth at bedtime latanoprost (XALATAN) 0.005 % ophthalmic solution Place 1 drop into both eyes nightly. naproxen sodium (ALEVE) 220 MG tablet Take 2 tablets by mouth once daily as needed for Pain upadacitinib (RINVOQ ORAL) Take 1 tablet by mouth once daily  No current facility-administered medications on file prior to visit.  Medical / Surgical History  Past Medical History: Diagnosis Date AA (aortic aneurysm) (CMS-HCC) Allergic state Arm fracture, left Arthritis COVID-19 07/2021 Glaucoma (increased eye pressure) Gout History of stroke Hypertension   Past Surgical History: Procedure Laterality Date KNEE  ARTHROSCOPY Right 12/31/1999 Dr. Monica Martinez COLONOSCOPY 10/30/2007 Removal ganglion of wrist left volar Left 05/22/2017 Dr.Menz Lumbar disc surgery Right shoulder scope   Physical Exam  Ht:179.1 cm (5' 10.5") Wt:83.1 kg (183 lb 3.2 oz) BMI: Body mass index is 25.91 kg/m.  General/Constitutional: No apparent distress: well-nourished and well developed. Eyes: Pupils equal, round with synchronous movement. Lymphatic: No palpable adenopathy. Respiratory: Patient has good chest rise and fall with inspiration and expiration. All lung fields are clear to auscultation bilaterally. There is no Rales, rhonchi or wheezes appreciated. Cardiovascular: Upon auscultation there is a regular rate and rhythm without any murmurs, rubs, gallops or heaves appreciated. There does not appear to be any swelling down the lower extremities. Posterior tibial pulses appreciated bilaterally, 2+. Integumentary: No impressive skin lesions present, except as noted in detailed exam. Neuro/Psych: Normal mood and affect, oriented to person, place and time. Noted to have some baseline tremors in his hands bilaterally with a pill-rolling aspect appreciated. Consistent with patient's history of Parkinson's. Musculoskeletal: see exam below  Right knee exam Upon inspection of the patient's right knee there does not appear to be any skin changes, open abrasions, swelling or redness. There is a varus alignment. Upon palpation, the patient reports having pain along the medial aspect of their knee. Patient has about 12 degrees off of full extension actively with ROM, and able to flex back to 113 degrees with mild pain. Varus and valgus stress testing shows positive pseudolaxity to valgus stressing. The patella tracks well within the femoral groove from flexion into extension with some appreciable crepitus. Anterior and posterior drawer testing negative. Patient is neurovascularly intact down their lower extremity to all dermatomes. Posterior  tibial pulses appreciated 2+.  Imaging right Knee Imaging: A series of x-rays were ordered and interpreted of the patient's right knee. These images included AP weightbearing, lateral and sunrise views. Upon inspection of AP views, there is noticeable loss of joint space primarily involving the medial joint line with near complete bone-on-bone articulation. Subchondral sclerosis is noted. There are small osteophytes noted. 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 Of note, there is some mild calcification of vasculature in the posterior recess of the knee. No fractures, lytic lesions or gross deformities appreciated on films.  Assesment and Plan Right knee DJD  I have recommended that Kirby Crigler undergo right total knee replacement. Consents has been signed. The risks, benefits, prognosis and alternatives including but not limited to DVT, PE, infection, neurovascular injury, failure of the procedure and death were explained to the patient and he is willing to proceed with surgery as described to him by myself. Plan will be for post operative admission of at least 1 midnight for pain control and PT. He will be managed with DVT prophylaxis, antibiotics preoperatively for 24 hours and aggressive in patient rehab.  Pre, intra and post op interventions were discussed. Patient has good understanding  Medication Reconciliation was performed. Discussed cessation of vitamins and supplements. Will look to start/stop any medications that was discussed at his preoperative meeting at the hospital.  A total of 50 minutes was spent reviewing patient's charts, medical reconciliation, discussing/educating the patient about surgical interventions, and answering any questions provided by the patient.  JOSHUA Kendrick Fries, [place] [facility] 09/30/2023  Electronically signed by Alan Rover, PA at 09/30/2023 1:05 PM EST  Back to top of Progress Notes

## 2023-10-06 ENCOUNTER — Ambulatory Visit: Payer: Medicare PPO

## 2023-10-06 ENCOUNTER — Encounter: Payer: Self-pay | Admitting: Orthopedic Surgery

## 2023-10-06 ENCOUNTER — Ambulatory Visit: Payer: Medicare PPO | Admitting: Urgent Care

## 2023-10-06 ENCOUNTER — Other Ambulatory Visit: Payer: Self-pay

## 2023-10-06 ENCOUNTER — Observation Stay: Payer: Medicare PPO

## 2023-10-06 ENCOUNTER — Encounter
Admission: RE | Disposition: A | Discharge status: Home / Self Care | Payer: Self-pay | Source: Home / Self Care | Attending: Orthopedic Surgery

## 2023-10-06 ENCOUNTER — Observation Stay
Admission: RE | Admit: 2023-10-06 | Discharge: 2023-10-07 | Disposition: A | Discharge status: Home / Self Care | Payer: Medicare PPO | Attending: Orthopedic Surgery | Admitting: Orthopedic Surgery

## 2023-10-06 DIAGNOSIS — Z8616 Personal history of COVID-19: Secondary | ICD-10-CM | POA: Diagnosis not present

## 2023-10-06 DIAGNOSIS — Z8673 Personal history of transient ischemic attack (TIA), and cerebral infarction without residual deficits: Secondary | ICD-10-CM | POA: Insufficient documentation

## 2023-10-06 DIAGNOSIS — I7143 Infrarenal abdominal aortic aneurysm, without rupture: Secondary | ICD-10-CM

## 2023-10-06 DIAGNOSIS — I10 Essential (primary) hypertension: Secondary | ICD-10-CM | POA: Insufficient documentation

## 2023-10-06 DIAGNOSIS — R0602 Shortness of breath: Secondary | ICD-10-CM

## 2023-10-06 DIAGNOSIS — M1711 Unilateral primary osteoarthritis, right knee: Principal | ICD-10-CM | POA: Insufficient documentation

## 2023-10-06 DIAGNOSIS — Z96651 Presence of right artificial knee joint: Secondary | ICD-10-CM

## 2023-10-06 HISTORY — PX: KNEE ARTHROPLASTY: SHX992

## 2023-10-06 SURGERY — ARTHROPLASTY, KNEE, TOTAL, USING IMAGELESS COMPUTER-ASSISTED NAVIGATION
Anesthesia: Spinal | Site: Knee | Laterality: Right

## 2023-10-06 MED ORDER — ASPIRIN 81 MG PO CHEW
CHEWABLE_TABLET | ORAL | Status: AC
  Filled 2023-10-06: qty 1

## 2023-10-06 MED ORDER — ACETAMINOPHEN 10 MG/ML IV SOLN
INTRAVENOUS | Status: AC
  Filled 2023-10-06: qty 100

## 2023-10-06 MED ORDER — TRANEXAMIC ACID-NACL 1000-0.7 MG/100ML-% IV SOLN
INTRAVENOUS | Status: AC
  Filled 2023-10-06: qty 100

## 2023-10-06 MED ORDER — ENSURE PRE-SURGERY PO LIQD
296.0000 mL | Freq: Once | ORAL | Status: DC
  Filled 2023-10-06: qty 296

## 2023-10-06 MED ORDER — HYDROXYZINE HCL 25 MG PO TABS: 25.0000 mg | ORAL_TABLET | Freq: Every evening | ORAL | Status: DC | PRN

## 2023-10-06 MED ORDER — METOCLOPRAMIDE HCL 10 MG PO TABS
10.0000 mg | ORAL_TABLET | Freq: Three times a day (TID) | ORAL | Status: DC
  Administered 2023-10-06 – 2023-10-07 (×3): 10 mg via ORAL

## 2023-10-06 MED ORDER — ASPIRIN 81 MG PO CHEW
81.0000 mg | CHEWABLE_TABLET | Freq: Two times a day (BID) | ORAL | Status: DC
  Administered 2023-10-06 – 2023-10-07 (×2): 81 mg via ORAL

## 2023-10-06 MED ORDER — ORAL CARE MOUTH RINSE: 15.0000 mL | OROMUCOSAL | Status: DC | PRN

## 2023-10-06 MED ORDER — CELECOXIB 200 MG PO CAPS
200.0000 mg | ORAL_CAPSULE | Freq: Two times a day (BID) | ORAL | Status: DC
  Administered 2023-10-06 – 2023-10-07 (×2): 200 mg via ORAL

## 2023-10-06 MED ORDER — PANTOPRAZOLE SODIUM 40 MG PO TBEC
DELAYED_RELEASE_TABLET | ORAL | Status: AC
  Filled 2023-10-06: qty 1

## 2023-10-06 MED ORDER — OXYCODONE HCL 5 MG PO TABS: 10.0000 mg | ORAL_TABLET | ORAL | Status: DC | PRN

## 2023-10-06 MED ORDER — ALUM & MAG HYDROXIDE-SIMETH 200-200-20 MG/5ML PO SUSP: 30.0000 mL | ORAL | Status: DC | PRN

## 2023-10-06 MED ORDER — MAGNESIUM HYDROXIDE 400 MG/5ML PO SUSP: 30.0000 mL | Freq: Every day | ORAL | Status: DC

## 2023-10-06 MED ORDER — CEFAZOLIN SODIUM-DEXTROSE 2-4 GM/100ML-% IV SOLN
INTRAVENOUS | Status: AC
  Filled 2023-10-06: qty 100

## 2023-10-06 MED ORDER — SODIUM CHLORIDE 0.9 % IR SOLN
Status: DC | PRN
  Administered 2023-10-06: 3000 mL

## 2023-10-06 MED ORDER — PROPOFOL 500 MG/50ML IV EMUL
INTRAVENOUS | Status: DC | PRN
  Administered 2023-10-06: 50 ug via INTRAVENOUS
  Administered 2023-10-06: 50 ug/kg/min via INTRAVENOUS
  Administered 2023-10-06: 50 mg via INTRAVENOUS

## 2023-10-06 MED ORDER — EPHEDRINE SULFATE-NACL 50-0.9 MG/10ML-% IV SOSY
PREFILLED_SYRINGE | INTRAVENOUS | Status: DC | PRN
  Administered 2023-10-06 (×2): 5 mg via INTRAVENOUS
  Administered 2023-10-06: 10 mg via INTRAVENOUS

## 2023-10-06 MED ORDER — SENNOSIDES-DOCUSATE SODIUM 8.6-50 MG PO TABS
1.0000 | ORAL_TABLET | Freq: Two times a day (BID) | ORAL | Status: DC
  Administered 2023-10-06 – 2023-10-07 (×2): 1 via ORAL

## 2023-10-06 MED ORDER — BUPIVACAINE HCL (PF) 0.5 % IJ SOLN
INTRAMUSCULAR | Status: DC | PRN
  Administered 2023-10-06: 3 mL

## 2023-10-06 MED ORDER — PANTOPRAZOLE SODIUM 40 MG PO TBEC
40.0000 mg | DELAYED_RELEASE_TABLET | Freq: Two times a day (BID) | ORAL | Status: DC
  Administered 2023-10-06 – 2023-10-07 (×2): 40 mg via ORAL

## 2023-10-06 MED ORDER — CELECOXIB 200 MG PO CAPS
ORAL_CAPSULE | ORAL | Status: AC
Start: 2023-10-06 — End: ?
  Filled 2023-10-06: qty 2

## 2023-10-06 MED ORDER — METOCLOPRAMIDE HCL 10 MG PO TABS
ORAL_TABLET | ORAL | Status: AC
Start: 2023-10-06 — End: ?
  Filled 2023-10-06: qty 1

## 2023-10-06 MED ORDER — FENTANYL CITRATE (PF) 100 MCG/2ML IJ SOLN
INTRAMUSCULAR | Status: AC
  Filled 2023-10-06: qty 2

## 2023-10-06 MED ORDER — ONDANSETRON HCL 4 MG/2ML IJ SOLN: 4.0000 mg | Freq: Four times a day (QID) | INTRAMUSCULAR | Status: DC | PRN

## 2023-10-06 MED ORDER — SURGIPHOR WOUND IRRIGATION SYSTEM - OPTIME
TOPICAL | Status: DC | PRN
  Administered 2023-10-06: 450 mL

## 2023-10-06 MED ORDER — SENNOSIDES-DOCUSATE SODIUM 8.6-50 MG PO TABS
ORAL_TABLET | ORAL | Status: AC
Start: 2023-10-06 — End: ?
  Filled 2023-10-06: qty 1

## 2023-10-06 MED ORDER — PROPOFOL 1000 MG/100ML IV EMUL
INTRAVENOUS | Status: AC
  Filled 2023-10-06: qty 100

## 2023-10-06 MED ORDER — FENTANYL CITRATE (PF) 100 MCG/2ML IJ SOLN
25.0000 ug | INTRAMUSCULAR | Status: DC | PRN
  Administered 2023-10-06: 50 ug via INTRAVENOUS

## 2023-10-06 MED ORDER — PHENYLEPHRINE HCL-NACL 20-0.9 MG/250ML-% IV SOLN
INTRAVENOUS | Status: AC
  Filled 2023-10-06: qty 250

## 2023-10-06 MED ORDER — TRAMADOL HCL 50 MG PO TABS: 50.0000 mg | ORAL_TABLET | ORAL | Status: DC | PRN

## 2023-10-06 MED ORDER — OXYCODONE HCL 5 MG PO TABS
ORAL_TABLET | ORAL | Status: AC
  Filled 2023-10-06: qty 1

## 2023-10-06 MED ORDER — FERROUS SULFATE 325 (65 FE) MG PO TABS
325.0000 mg | ORAL_TABLET | Freq: Every morning | ORAL | Status: DC
  Administered 2023-10-07: 325 mg via ORAL

## 2023-10-06 MED ORDER — PHENOL 1.4 % MT LIQD: 1.0000 | OROMUCOSAL | Status: DC | PRN

## 2023-10-06 MED ORDER — TRANEXAMIC ACID-NACL 1000-0.7 MG/100ML-% IV SOLN
1000.0000 mg | Freq: Once | INTRAVENOUS | Status: AC
  Administered 2023-10-06: 1000 mg via INTRAVENOUS
  Filled 2023-10-06: qty 100

## 2023-10-06 MED ORDER — OXYCODONE HCL 5 MG/5ML PO SOLN: 5.0000 mg | Freq: Once | ORAL | Status: AC | PRN

## 2023-10-06 MED ORDER — CELECOXIB 200 MG PO CAPS
ORAL_CAPSULE | ORAL | Status: AC
  Filled 2023-10-06: qty 2

## 2023-10-06 MED ORDER — DEXAMETHASONE SODIUM PHOSPHATE 10 MG/ML IJ SOLN
INTRAMUSCULAR | Status: AC
  Filled 2023-10-06: qty 1

## 2023-10-06 MED ORDER — LATANOPROST 0.005 % OP SOLN
1.0000 [drp] | Freq: Every day | OPHTHALMIC | Status: DC
  Administered 2023-10-06: 1 [drp] via OPHTHALMIC
  Filled 2023-10-06 (×2): qty 2.5

## 2023-10-06 MED ORDER — MENTHOL 3 MG MT LOZG
1.0000 | LOZENGE | OROMUCOSAL | Status: DC | PRN
Start: 2023-10-06 — End: 2023-10-07

## 2023-10-06 MED ORDER — SUCCINYLCHOLINE CHLORIDE 200 MG/10ML IV SOSY
PREFILLED_SYRINGE | INTRAVENOUS | Status: DC | PRN
  Administered 2023-10-06: 40 mg via INTRAVENOUS

## 2023-10-06 MED ORDER — FLEET ENEMA RE ENEM: 1.0000 | ENEMA | Freq: Once | RECTAL | Status: DC | PRN

## 2023-10-06 MED ORDER — ACETAMINOPHEN 325 MG PO TABS: 325.0000 mg | ORAL_TABLET | Freq: Four times a day (QID) | ORAL | Status: DC | PRN

## 2023-10-06 MED ORDER — BISACODYL 10 MG RE SUPP
10.0000 mg | Freq: Every day | RECTAL | Status: DC | PRN
Start: 2023-10-06 — End: 2023-10-07

## 2023-10-06 MED ORDER — ACETAMINOPHEN 10 MG/ML IV SOLN
1000.0000 mg | Freq: Four times a day (QID) | INTRAVENOUS | Status: DC
  Administered 2023-10-06 – 2023-10-07 (×3): 1000 mg via INTRAVENOUS

## 2023-10-06 MED ORDER — ALLOPURINOL 100 MG PO TABS
200.0000 mg | ORAL_TABLET | Freq: Every morning | ORAL | Status: DC
  Administered 2023-10-07: 200 mg via ORAL
  Filled 2023-10-06: qty 2

## 2023-10-06 MED ORDER — HYDROMORPHONE HCL 1 MG/ML IJ SOLN: 0.5000 mg | INTRAMUSCULAR | Status: DC | PRN

## 2023-10-06 MED ORDER — SODIUM CHLORIDE (PF) 0.9 % IJ SOLN
INTRAMUSCULAR | Status: DC | PRN
  Administered 2023-10-06: 120 mL

## 2023-10-06 MED ORDER — ONDANSETRON HCL 4 MG PO TABS
4.0000 mg | ORAL_TABLET | Freq: Four times a day (QID) | ORAL | Status: DC | PRN
Start: 2023-10-06 — End: 2023-10-07

## 2023-10-06 MED ORDER — CARBIDOPA-LEVODOPA 25-100 MG PO TABS
1.0000 | ORAL_TABLET | Freq: Two times a day (BID) | ORAL | Status: DC
  Administered 2023-10-06 – 2023-10-07 (×2): 1 via ORAL
  Filled 2023-10-06 (×2): qty 1

## 2023-10-06 MED ORDER — SODIUM CHLORIDE 0.9 % IV SOLN: INTRAVENOUS | Status: DC

## 2023-10-06 MED ORDER — GABAPENTIN 300 MG PO CAPS
ORAL_CAPSULE | ORAL | Status: AC
  Filled 2023-10-06: qty 1

## 2023-10-06 MED ORDER — GLYCOPYRROLATE 0.2 MG/ML IJ SOLN
INTRAMUSCULAR | Status: DC | PRN
  Administered 2023-10-06: .2 mg via INTRAVENOUS

## 2023-10-06 MED ORDER — FENTANYL CITRATE (PF) 100 MCG/2ML IJ SOLN
INTRAMUSCULAR | Status: DC | PRN
  Administered 2023-10-06 (×2): 50 ug via INTRAVENOUS

## 2023-10-06 MED ORDER — CEFAZOLIN SODIUM-DEXTROSE 2-4 GM/100ML-% IV SOLN
2.0000 g | Freq: Four times a day (QID) | INTRAVENOUS | Status: AC
  Administered 2023-10-06 – 2023-10-07 (×2): 2 g via INTRAVENOUS

## 2023-10-06 MED ORDER — OXYCODONE HCL 5 MG PO TABS
5.0000 mg | ORAL_TABLET | Freq: Once | ORAL | Status: AC | PRN
Start: 2023-10-06 — End: 2023-10-06
  Administered 2023-10-06: 5 mg via ORAL

## 2023-10-06 MED ORDER — ACETAMINOPHEN 10 MG/ML IV SOLN
INTRAVENOUS | Status: DC | PRN
  Administered 2023-10-06: 1000 mg via INTRAVENOUS

## 2023-10-06 MED ORDER — DIPHENHYDRAMINE HCL 12.5 MG/5ML PO ELIX: 12.5000 mg | ORAL_SOLUTION | ORAL | Status: DC | PRN

## 2023-10-06 MED ORDER — CHLORHEXIDINE GLUCONATE 0.12 % MT SOLN
OROMUCOSAL | Status: AC
  Filled 2023-10-06: qty 15

## 2023-10-06 MED ORDER — OXYCODONE HCL 5 MG PO TABS: 5.0000 mg | ORAL_TABLET | ORAL | Status: DC | PRN

## 2023-10-06 SURGICAL SUPPLY — 66 items
ATTUNE MED DOME PAT 41 KNEE (Knees) IMPLANT
ATTUNE PS FEM RT SZ9 CEM KNEE (Femur) IMPLANT
BASE TIBIAL ATTUNE KNEE SZ9 (Knees) IMPLANT
BATTERY INSTRU NAVIGATION (MISCELLANEOUS) ×4 IMPLANT
BIT DRILL QUICK REL 1/8 2PK SL (BIT) ×1 IMPLANT
BLADE CLIPPER SURG (BLADE) IMPLANT
BLADE SAW 70X12.5 (BLADE) ×1 IMPLANT
BLADE SAW 90X13X1.19 OSCILLAT (BLADE) ×1 IMPLANT
BLADE SAW 90X25X1.19 OSCILLAT (BLADE) ×1 IMPLANT
BONE CEMENT GENTAMICIN (Cement) ×2 IMPLANT
BRUSH SCRUB EZ PLAIN DRY (MISCELLANEOUS) ×1 IMPLANT
CEMENT BONE GENTAMICIN 40 (Cement) IMPLANT
COOLER POLAR GLACIER W/PUMP (MISCELLANEOUS) ×1 IMPLANT
CUFF TRNQT CYL 24X4X16.5-23 (TOURNIQUET CUFF) IMPLANT
DRAPE SHEET LG 3/4 BI-LAMINATE (DRAPES) ×1 IMPLANT
DRSG AQUACEL AG ADV 3.5X14 (GAUZE/BANDAGES/DRESSINGS) ×1 IMPLANT
DRSG MEPILEX SACRM 8.7X9.8 (GAUZE/BANDAGES/DRESSINGS) ×1 IMPLANT
DRSG TEGADERM 4X4.75 (GAUZE/BANDAGES/DRESSINGS) ×1 IMPLANT
DURAPREP 26ML APPLICATOR (WOUND CARE) ×2 IMPLANT
ELECT CAUTERY BLADE 6.4 (BLADE) ×1 IMPLANT
ELECT REM PT RETURN 9FT ADLT (ELECTROSURGICAL) ×1
ELECTRODE REM PT RTRN 9FT ADLT (ELECTROSURGICAL) ×1 IMPLANT
EVACUATOR 1/8 PVC DRAIN (DRAIN) ×1 IMPLANT
EX-PIN ORTHOLOCK NAV 4X150 (PIN) ×2 IMPLANT
GAUZE XEROFORM 1X8 LF (GAUZE/BANDAGES/DRESSINGS) ×1 IMPLANT
GLOVE BIOGEL M STRL SZ7.5 (GLOVE) ×6 IMPLANT
GLOVE SURG UNDER POLY LF SZ8 (GLOVE) ×2 IMPLANT
GOWN STRL REUS W/ TWL LRG LVL3 (GOWN DISPOSABLE) ×1 IMPLANT
GOWN STRL REUS W/ TWL XL LVL3 (GOWN DISPOSABLE) ×1 IMPLANT
GOWN TOGA ZIPPER T7+ PEEL AWAY (MISCELLANEOUS) ×1 IMPLANT
HOLDER FOLEY CATH W/STRAP (MISCELLANEOUS) ×1 IMPLANT
HOOD PEEL AWAY T7 (MISCELLANEOUS) ×1 IMPLANT
INSERT TIBIAL ATTUNE SZ9 5MM (Insert) IMPLANT
IV NS IRRIG 3000ML ARTHROMATIC (IV SOLUTION) ×1 IMPLANT
KIT TURNOVER KIT A (KITS) ×1 IMPLANT
KNIFE SCULPS 14X20 (INSTRUMENTS) ×1 IMPLANT
MANIFOLD NEPTUNE II (INSTRUMENTS) ×2 IMPLANT
NDL SPNL 20GX3.5 QUINCKE YW (NEEDLE) ×2 IMPLANT
NEEDLE SPNL 20GX3.5 QUINCKE YW (NEEDLE) ×2 IMPLANT
PACK TOTAL KNEE (MISCELLANEOUS) ×1 IMPLANT
PAD ABD DERMACEA PRESS 5X9 (GAUZE/BANDAGES/DRESSINGS) ×2 IMPLANT
PAD ARMBOARD 7.5X6 YLW CONV (MISCELLANEOUS) ×3 IMPLANT
PAD WRAPON POLAR KNEE (MISCELLANEOUS) ×1 IMPLANT
PENCIL SMOKE EVACUATOR COATED (MISCELLANEOUS) ×1 IMPLANT
PIN DRILL FIX HALF THREAD (BIT) ×2 IMPLANT
PIN FIXATION 1/8DIA X 3INL (PIN) ×1 IMPLANT
PULSAVAC PLUS IRRIG FAN TIP (DISPOSABLE) ×1
SOLUTION IRRIG SURGIPHOR (IV SOLUTION) ×1 IMPLANT
SPONGE DRAIN TRACH 4X4 STRL 2S (GAUZE/BANDAGES/DRESSINGS) ×1 IMPLANT
STAPLER SKIN PROX 35W (STAPLE) ×1 IMPLANT
STOCKINETTE STRL BIAS CUT 8X4 (MISCELLANEOUS) ×1 IMPLANT
STRAP TIBIA SHORT (MISCELLANEOUS) ×1 IMPLANT
SUCTION TUBE FRAZIER 10FR DISP (SUCTIONS) ×1 IMPLANT
SUT BONE WAX W31G (SUTURE) IMPLANT
SUT VIC AB 0 CT1 36 (SUTURE) ×1 IMPLANT
SUT VIC AB 1 CT1 36 (SUTURE) ×2 IMPLANT
SUT VIC AB 2-0 CT2 27 (SUTURE) ×1 IMPLANT
SYR 30ML LL (SYRINGE) ×2 IMPLANT
TIBIAL BASE ATTUNE KNEE SZ9 (Knees) ×1 IMPLANT
TIP FAN IRRIG PULSAVAC PLUS (DISPOSABLE) ×1 IMPLANT
TOWEL OR 17X26 4PK STRL BLUE (TOWEL DISPOSABLE) ×1 IMPLANT
TOWER CARTRIDGE SMART MIX (DISPOSABLE) ×1 IMPLANT
TRAP FLUID SMOKE EVACUATOR (MISCELLANEOUS) ×1 IMPLANT
TRAY FOLEY MTR SLVR 16FR STAT (SET/KITS/TRAYS/PACK) ×1 IMPLANT
WATER STERILE IRR 1000ML POUR (IV SOLUTION) ×1 IMPLANT
WRAPON POLAR PAD KNEE (MISCELLANEOUS) ×1

## 2023-10-06 NOTE — Plan of Care (Signed)
  Problem: Pain Managment: Goal: General experience of comfort will improve and/or be controlled 10/06/2023 1817 by Wilfred Lacy, RN Outcome: Progressing 10/06/2023 1744 by Wilfred Lacy, RN Outcome: Progressing   Problem: Safety: Goal: Ability to remain free from injury will improve Outcome: Progressing

## 2023-10-06 NOTE — Progress Notes (Signed)
Patient is not able to walk the distance required to go the bathroom, or he/she is unable to safely negotiate stairs required to access the bathroom.  A 3in1 BSC will alleviate this problem   Alan Dennis, Jr. M.D.  

## 2023-10-06 NOTE — Interval H&P Note (Signed)
History and Physical Interval Note:  10/06/2023 11:13 AM  Alan Dennis  has presented today for surgery, with the diagnosis of PRIMARY OSTEOARTHRITIS OF RIGHT KNEE..  The various methods of treatment have been discussed with the patient and family. After consideration of risks, benefits and other options for treatment, the patient has consented to  Procedure(s): COMPUTER ASSISTED TOTAL KNEE ARTHROPLASTY (Right) as a surgical intervention.  The patient's history has been reviewed, patient examined, no change in status, stable for surgery.  I have reviewed the patient's chart and labs.  Questions were answered to the patient's satisfaction.     Kinnedy Mongiello P Troy Kanouse

## 2023-10-06 NOTE — Transfer of Care (Signed)
Immediate Anesthesia Transfer of Care Note  Patient: AMIAS ZUR  Procedure(s) Performed: COMPUTER ASSISTED TOTAL KNEE ARTHROPLASTY (Right: Knee)  Patient Location: PACU  Anesthesia Type:Spinal  Level of Consciousness: drowsy  Airway & Oxygen Therapy: Patient Spontanous Breathing and Patient connected to face mask oxygen  Post-op Assessment: Report given to RN and Post -op Vital signs reviewed and stable  Post vital signs: Reviewed  Last Vitals:  Vitals Value Taken Time  BP 128/72   Temp    Pulse 74 10/06/23 1557  Resp 15 10/06/23 1557  SpO2 100 % 10/06/23 1557  Vitals shown include unfiled device data.  Last Pain:  Vitals:   10/06/23 1034  TempSrc: Temporal  PainSc: 0-No pain         Complications: No notable events documented.

## 2023-10-06 NOTE — Plan of Care (Signed)
  Problem: Pain Managment: Goal: General experience of comfort will improve and/or be controlled Outcome: Progressing

## 2023-10-06 NOTE — Progress Notes (Signed)
PT Cancellation Note  Patient Details Name: Alan Dennis MRN: [id number] DOB: 1941-07-08   Cancelled Treatment:    Reason Eval/Treat Not Completed: Patient not medically ready.  PT consult received.  Pt in PACU upon PT arrival (approximately 1630); s/p surgery with spinal anesthesia.  Pt unable to wiggle toes or actively move ankles (DF/PF).  Discussed pt's status with pt's nurse.  Will hold therapy at this time and re-attempt PT evaluation tomorrow.  Hendricks Limes, PT 10/06/23, 5:24 PM

## 2023-10-06 NOTE — Anesthesia Procedure Notes (Signed)
Procedure Name: Intubation Date/Time: 10/06/2023 11:48 AM  Performed by: Elisabeth Pigeon, CRNAPre-anesthesia Checklist: Patient identified, Emergency Drugs available, Suction available and Patient being monitored Patient Re-evaluated:Patient Re-evaluated prior to induction Oxygen Delivery Method: Circle system utilized Preoxygenation: Pre-oxygenation with 100% oxygen Induction Type: IV induction Ventilation: Mask ventilation without difficulty LMA: LMA inserted LMA Size: 4.0 Tube type: Oral Number of attempts: 1 Placement Confirmation: ETT inserted through vocal cords under direct vision, positive ETCO2 and breath sounds checked- equal and bilateral Tube secured with: Tape Dental Injury: Teeth and Oropharynx as per pre-operative assessment

## 2023-10-06 NOTE — Anesthesia Procedure Notes (Signed)
Spinal  Patient location during procedure: OR End time: 10/06/2023 1:01 PM Reason for block: surgical anesthesia Staffing Performed: resident/CRNA  Anesthesiologist: Yevette Edwards, MD Resident/CRNA: Mathews Argyle, CRNA Performed by: Elisabeth Pigeon, CRNA Authorized by: Yevette Edwards, MD   Preanesthetic Checklist Completed: patient identified, IV checked, site marked, risks and benefits discussed, surgical consent, monitors and equipment checked, pre-op evaluation and timeout performed Spinal Block Patient position: sitting Prep: Betadine Patient monitoring: heart rate, continuous pulse ox, blood pressure and cardiac monitor Approach: midline Location: L4-5 Injection technique: single-shot Needle Needle type: Whitacre and Introducer  Needle gauge: 24 G Needle length: 9 cm Assessment Events: failed spinal and CSF return Additional Notes Negative paresthesia. Negative blood return. Positive free-flowing CSF. Expiration date of kit checked and confirmed. Patient tolerated procedure well, without complications. Spinal not setting up, patient moving legs, positive sensation to cold.Dr Pernell Dupre at bedside, proceed with GA with LMA.

## 2023-10-06 NOTE — Op Note (Signed)
OPERATIVE NOTE  DATE OF SURGERY:  10/06/2023  PATIENT NAME:  Alan Dennis   DOB: 05/03/1941  MRN: [id number]  PRE-OPERATIVE DIAGNOSIS: Degenerative arthrosis of the right knee, primary  POST-OPERATIVE DIAGNOSIS:  Same  PROCEDURE:  Right total knee arthroplasty using computer-assisted navigation  SURGEON:  Jena Gauss. M.D.  ASSISTANT:  Gean Birchwood, PA-C (present and scrubbed throughout the case, critical for assistance with exposure, retraction, instrumentation, and closure)  ANESTHESIA: spinal and general  ESTIMATED BLOOD LOSS: 75 mL  FLUIDS REPLACED: 900 mL of crystalloid  TOURNIQUET TIME: 113 minutes  DRAINS: 2 medium Hemovac drains  SOFT TISSUE RELEASES: Anterior cruciate ligament, posterior cruciate ligament, deep medial collateral ligament, patellofemoral ligament  IMPLANTS UTILIZED: DePuy Attune size 9 posterior stabilized femoral component (cemented), size 9 rotating platform tibial component (cemented), 41 mm medialized dome patella (cemented), and a 5 mm stabilized rotating platform polyethylene insert.  INDICATIONS FOR SURGERY: Alan Dennis is a 83 y.o. year old male with a long history of progressive knee pain. X-rays demonstrated severe degenerative changes in tricompartmental fashion. The patient had not seen any significant improvement despite conservative nonsurgical intervention. After discussion of the risks and benefits of surgical intervention, the patient expressed understanding of the risks benefits and agree with plans for total knee arthroplasty.   The risks, benefits, and alternatives were discussed at length including but not limited to the risks of infection, bleeding, nerve injury, stiffness, blood clots, the need for revision surgery, cardiopulmonary complications, among others, and they were willing to proceed.  PROCEDURE IN DETAIL: The patient was brought into the operating room and, after adequate spinal and general anesthesia  was achieved, a tourniquet was placed on the patient's upper thigh. The patient's knee and leg were cleaned and prepped with alcohol and DuraPrep and draped in the usual sterile fashion. A "timeout" was performed as per usual protocol. The lower extremity was exsanguinated using an Esmarch, and the tourniquet was inflated to 300 mmHg. An anterior longitudinal incision was made followed by a standard mid vastus approach. The deep fibers of the medial collateral ligament were elevated in a subperiosteal fashion off of the medial flare of the tibia so as to maintain a continuous soft tissue sleeve. The patella was subluxed laterally and the patellofemoral ligament was incised. Inspection of the knee demonstrated severe degenerative changes with full-thickness loss of articular cartilage. Osteophytes were debrided using a rongeur. Anterior and posterior cruciate ligaments were excised. Two 4.0 mm Schanz pins were inserted in the femur and into the tibia for attachment of the array of trackers used for computer-assisted navigation. Hip center was identified using a circumduction technique. Distal landmarks were mapped using the computer. The distal femur and proximal tibia were mapped using the computer. The distal femoral cutting guide was positioned using computer-assisted navigation so as to achieve a 5 distal valgus cut. The femur was sized and it was felt that a size 9 femoral component was appropriate. A size 9 femoral cutting guide was positioned and the anterior cut was performed and verified using the computer. This was followed by completion of the posterior and chamfer cuts. Femoral cutting guide for the central box was then positioned in the center box cut was performed.  Attention was then directed to the proximal tibia. Medial and lateral menisci were excised. The extramedullary tibial cutting guide was positioned using computer-assisted navigation so as to achieve a 0 varus-valgus alignment and 3  posterior slope. The cut was performed and verified using the  computer. The proximal tibia was sized and it was felt that a size 9 tibial tray was appropriate. Tibial and femoral trials were inserted followed by insertion of a 5 mm polyethylene insert.  Good balance was noted in flexion but the knee was tight in extension and lacks several degrees of full extension.  It was thus elected to resect additional bone from the distal femur.  The femoral trial was removed.  The distal femoral cutting guide was repositioned so as to resect an additional 4 mm of bone distally.  Cut was performed verified using computer.  This was followed by recutting the chamfer cuts and the central box.  The size 9 femoral trial was inserted followed by insertion of the 5 mm polyethylene insert.  This allowed for excellent mediolateral soft tissue balancing both in flexion and in full extension. Finally, the patella was cut and prepared so as to accommodate a 41 mm medialized dome patella. A patella trial was placed and the knee was placed through a range of motion with excellent patellar tracking appreciated. The femoral trial was removed after debridement of posterior osteophytes. The central post-hole for the tibial component was reamed followed by insertion of a keel punch. Tibial trials were then removed. Cut surfaces of bone were irrigated with copious amounts of normal saline using pulsatile lavage and then suctioned dry. Polymethylmethacrylate cement with gentamicin was prepared in the usual fashion using a vacuum mixer. Cement was applied to the cut surface of the proximal tibia as well as along the undersurface of a size 9 rotating platform tibial component. Tibial component was positioned and impacted into place. Excess cement was removed using Personal assistant. Cement was then applied to the cut surfaces of the femur as well as along the posterior flanges of the size 9 femoral component. The femoral component was positioned and  impacted into place. Excess cement was removed using Personal assistant. A 5 mm polyethylene trial was inserted and the knee was brought into full extension with steady axial compression applied. Finally, cement was applied to the backside of a 41 mm medialized dome patella and the patellar component was positioned and patellar clamp applied. Excess cement was removed using Personal assistant. After adequate curing of the cement, the tourniquet was deflated after a total tourniquet time of 113 minutes. Hemostasis was achieved using electrocautery. The knee was irrigated with copious amounts of normal saline using pulsatile lavage followed by 450 ml of Surgiphor and then suctioned dry. 20 mL of 1.3% Exparel and 60 mL of 0.25% Marcaine in 40 mL of normal saline was injected along the posterior capsule, medial and lateral gutters, and along the arthrotomy site. A 5 mm stabilized rotating platform polyethylene insert was inserted and the knee was placed through a range of motion with excellent mediolateral soft tissue balancing appreciated and excellent patellar tracking noted. 2 medium drains were placed in the wound bed and brought out through separate stab incisions. The medial parapatellar portion of the incision was reapproximated using interrupted sutures of #1 Vicryl. Subcutaneous tissue was approximated in layers using first #0 Vicryl followed #2-0 Vicryl. The skin was approximated with skin staples. A sterile dressing was applied.  The patient tolerated the procedure well and was transported to the recovery room in stable condition.    Shante Maysonet P. Angie Fava., M.D.

## 2023-10-06 NOTE — Anesthesia Preprocedure Evaluation (Signed)
Anesthesia Evaluation  Patient identified by MRN, date of birth, ID band Patient awake    Reviewed: Allergy & Precautions, H&P , NPO status , Patient's Chart, lab work & pertinent test results, reviewed documented beta blocker date and time   Airway Mallampati: II   Neck ROM: full    Dental  (+) Poor Dentition   Pulmonary neg pulmonary ROS, former smoker   Pulmonary exam normal        Cardiovascular Exercise Tolerance: Poor hypertension, On Medications + Peripheral Vascular Disease  Normal cardiovascular exam Rhythm:regular Rate:Normal     Neuro/Psych  PSYCHIATRIC DISORDERS       Neuromuscular disease CVA    GI/Hepatic Neg liver ROS, hiatal hernia,GERD  Medicated,,  Endo/Other  negative endocrine ROS    Renal/GU negative Renal ROS  negative genitourinary   Musculoskeletal   Abdominal   Peds  Hematology  (+) Blood dyscrasia, anemia   Anesthesia Other Findings Past Medical History: No date: AAA (abdominal aortic aneurysm) (HCC) No date: Anemia No date: Aneurysm of right common iliac artery (HCC) No date: Arthritis No date: Bilateral carotid artery stenosis 2022: COVID-19 No date: Degenerative arthritis of right knee No date: Frequent PVCs No date: GERD (gastroesophageal reflux disease) No date: Glaucoma No date: Gout No date: History of hiatal hernia     Comment:  2018 No date: Hyperlipidemia No date: Hypertension No date: Panic attacks No date: Parkinson disease (HCC) No date: Peripheral vascular disease (HCC) No date: Sciatica 2015: Stroke [facility])     Comment:  TIA - no deficits 2022: Syncopal episodes No date: Vitamin D deficiency Past Surgical History: 2005: ABDOMINAL AORTIC ANEURYSM REPAIR No date: BACK SURGERY     Comment:  spur removal, (also, major back surg in 1970s) No date: COLONOSCOPY 05/08/2017: ESOPHAGOGASTRODUODENOSCOPY (EGD) WITH PROPOFOL; N/A     Comment:  Procedure:  ESOPHAGOGASTRODUODENOSCOPY (EGD) WITH               PROPOFOL;  Surgeon: Toney Reil, MD;  Location:               [facility] ENDOSCOPY;  Service: Gastroenterology;  Laterality:               N/A; No date: EYE SURGERY     Comment:  bilateral 05/22/2017: GANGLION CYST EXCISION; Left     Comment:  Procedure: REMOVAL GANGLION OF WRIST;  Surgeon: Kennedy Bucker, MD;  Location: [facility] ORS;  Service: Orthopedics;               Laterality: Left; 02/20/2017: IMAGE GUIDED SINUS SURGERY; N/A     Comment:  Procedure: IMAGE GUIDED SINUS SURGERY;  Surgeon:               Vernie Murders, MD;  Location: [facility];                Service: ENT;  Laterality: N/A;  need disk GAVE DISK TO               CE[facility] 5-15 KP No date: NASAL SINUS SURGERY     Comment:  several No date: SHOULDER SURGERY; Right 02/20/2017: SINUSOTOMY; Right     Comment:  Procedure: SINUSOTOMY right endoscopic with removal               content;  Surgeon: Vernie Murders, MD;  Location: [facility];  Service: ENT;  Laterality: Right;   Reproductive/Obstetrics negative OB ROS                             Anesthesia Physical Anesthesia Plan  ASA: 3  Anesthesia Plan: Spinal   Post-op Pain Management:    Induction:   PONV Risk Score and Plan:   Airway Management Planned:   Additional Equipment:   Intra-op Plan:   Post-operative Plan:   Informed Consent: I have reviewed the patients History and Physical, chart, labs and discussed the procedure including the risks, benefits and alternatives for the proposed anesthesia with the patient or authorized representative who has indicated his/her understanding and acceptance.     Dental Advisory Given  Plan Discussed with: CRNA  Anesthesia Plan Comments:        Anesthesia Quick Evaluation

## 2023-10-07 ENCOUNTER — Encounter: Payer: Self-pay | Admitting: Orthopedic Surgery

## 2023-10-07 DIAGNOSIS — M1711 Unilateral primary osteoarthritis, right knee: Secondary | ICD-10-CM | POA: Diagnosis not present

## 2023-10-07 MED ORDER — CELECOXIB 200 MG PO CAPS: 200.0000 mg | ORAL_CAPSULE | Freq: Two times a day (BID) | ORAL | 1 refills | Status: DC

## 2023-10-07 MED ORDER — FERROUS SULFATE 325 (65 FE) MG PO TABS
ORAL_TABLET | ORAL | Status: AC
Start: 2023-10-07 — End: ?
  Filled 2023-10-07: qty 1

## 2023-10-07 MED ORDER — CELECOXIB 200 MG PO CAPS
ORAL_CAPSULE | ORAL | Status: AC
  Filled 2023-10-07: qty 1

## 2023-10-07 MED ORDER — TRAMADOL HCL 50 MG PO TABS: 50.0000 mg | ORAL_TABLET | ORAL | 0 refills | Status: DC | PRN

## 2023-10-07 MED ORDER — ASPIRIN 81 MG PO CHEW
CHEWABLE_TABLET | ORAL | Status: AC
  Filled 2023-10-07: qty 1

## 2023-10-07 MED ORDER — ASPIRIN 81 MG PO TBEC: 81.0000 mg | DELAYED_RELEASE_TABLET | Freq: Two times a day (BID) | ORAL | 12 refills | Status: AC

## 2023-10-07 MED ORDER — ALLOPURINOL 300 MG PO TABS
ORAL_TABLET | ORAL | Status: AC
  Filled 2023-10-07: qty 1

## 2023-10-07 MED ORDER — ACETAMINOPHEN 10 MG/ML IV SOLN
INTRAVENOUS | Status: AC
Start: 2023-10-07 — End: ?
  Filled 2023-10-07: qty 100

## 2023-10-07 MED ORDER — SENNOSIDES-DOCUSATE SODIUM 8.6-50 MG PO TABS
ORAL_TABLET | ORAL | Status: AC
  Filled 2023-10-07: qty 1

## 2023-10-07 MED ORDER — OXYCODONE HCL 5 MG PO TABS: 5.0000 mg | ORAL_TABLET | ORAL | 0 refills | Status: DC | PRN

## 2023-10-07 MED ORDER — METOCLOPRAMIDE HCL 10 MG PO TABS
ORAL_TABLET | ORAL | Status: AC
Start: 2023-10-07 — End: ?
  Filled 2023-10-07: qty 1

## 2023-10-07 MED ORDER — CEFAZOLIN SODIUM-DEXTROSE 2-4 GM/100ML-% IV SOLN
INTRAVENOUS | Status: AC
  Filled 2023-10-07: qty 100

## 2023-10-07 MED ORDER — METOCLOPRAMIDE HCL 10 MG PO TABS
ORAL_TABLET | ORAL | Status: AC
  Filled 2023-10-07: qty 1

## 2023-10-07 MED ORDER — PANTOPRAZOLE SODIUM 40 MG PO TBEC
DELAYED_RELEASE_TABLET | ORAL | Status: AC
  Filled 2023-10-07: qty 1

## 2023-10-07 MED ORDER — MAGNESIUM HYDROXIDE 400 MG/5ML PO SUSP
ORAL | Status: AC
  Filled 2023-10-07: qty 30

## 2023-10-07 MED ORDER — ACETAMINOPHEN 10 MG/ML IV SOLN
INTRAVENOUS | Status: AC
  Filled 2023-10-07: qty 100

## 2023-10-07 NOTE — Evaluation (Signed)
Occupational Therapy Evaluation Patient Details Name: Alan Dennis MRN: [id number] DOB: 04/03/1941 Today's Date: 10/07/2023   History of Present Illness Patient is a 83 year old male with degenerative arthrosis of the right knee s/p right total knee arthroplasty. History of Parkinson's   Clinical Impression   Alan Dennis was seen for OT evaluation this date, POD#1 from above surgery. Pt reports he was independent-MOD I in all ADLs prior to surgery, however occasionally using a sock aid for LB dressing 2/2 R knee/hip pain. Pt is eager to return to PLOF with less pain and improved safety and independence. Pt currently requires minimal assist for LB dressing while in seated position due to pain and limited AROM of R knee. Pt instructed in polar care mgt, falls prevention strategies, home/routines modifications, DME/AE for LB bathing and dressing tasks, and compression stocking mgt. Pt would benefit from skilled OT services including additional instruction in dressing techniques with or without assistive devices for dressing and bathing skills to support recall and carryover prior to discharge and ultimately to maximize safety, independence, and minimize falls risk and caregiver burden. Do not currently anticipate any OT needs following this hospitalization.          If plan is discharge home, recommend the following: A little help with walking and/or transfers;A little help with bathing/dressing/bathroom;Help with stairs or ramp for entrance;Assistance with cooking/housework;Assist for transportation    Functional Status Assessment  Patient has had a recent decline in their functional status and demonstrates the ability to make significant improvements in function in a reasonable and predictable amount of time.  Equipment Recommendations  BSC/3in1    Recommendations for Other Services       Precautions / Restrictions Precautions Precautions: Knee;Fall Precaution Booklet Issued: Yes  (comment) Restrictions Weight Bearing Restrictions Per Provider Order: Yes RLE Weight Bearing Per Provider Order: Weight bearing as tolerated      Mobility Bed Mobility Overal bed mobility: Needs Assistance             General bed mobility comments: NT, pt in recliner at start/end of session.    Transfers Overall transfer level: Needs assistance Equipment used: Rolling walker (2 wheels) Transfers: Sit to/from Stand Sit to Stand: Min assist           General transfer comment: lifting assistance required for standing. patient prefers to "pull up" using RUE to faciliate standing as he does at home.      Balance Overall balance assessment: Needs assistance Sitting-balance support: Feet supported Sitting balance-Leahy Scale: Good Sitting balance - Comments: steady with dynamic sitting balance during funcitonal activity. No LOB appreciated reaching outside BOS.   Standing balance support: Bilateral upper extremity supported, Reliant on assistive device for balance, During functional activity Standing balance-Leahy Scale: Fair                             ADL either performed or assessed with clinical judgement   ADL Overall ADL's : Needs assistance/impaired                                       General ADL Comments: MIN A For LB ADL Management. Pt is functionally limited by decreased LB access and decreased AROM of his R LE.     Vision Baseline Vision/History: 1 Wears glasses Ability to See in Adequate Light: 1 Impaired Patient Visual  Report: No change from baseline       Perception         Praxis         Pertinent Vitals/Pain Pain Assessment Pain Assessment: 0-10 Pain Score: 3  Pain Location: R knee Pain Descriptors / Indicators: Discomfort Pain Intervention(s): Limited activity within patient's tolerance, Monitored during session, Repositioned     Extremity/Trunk Assessment Upper Extremity Assessment Upper Extremity  Assessment: Overall WFL for tasks assessed (Mild tremoring with functional tasks. Improves as activity progresses.)   Lower Extremity Assessment Lower Extremity Assessment: Overall WFL for tasks assessed;Defer to PT evaluation RLE Deficits / Details: patient able to weight bear without knee buckling. RLE Sensation: WNL       Communication Communication Communication: No apparent difficulties   Cognition Arousal: Alert Behavior During Therapy: WFL for tasks assessed/performed Overall Cognitive Status: Within Functional Limits for tasks assessed                                       General Comments  education provided for positioning of RLE to promote knee ROM and avoiding resting with pillow behind the knee    Exercises Other Exercises Other Exercises: Pt educated in falls prevention strategies, safe use of AE/DME for LB ADL management, compression stocking management, polar care management, complementary alternative methods for pain management including gentle self-massage and distraction techniques, knee precautions and routines modifications to support safety and fxl independence during meaningful occupations of daily life.   Shoulder Instructions      Home Living Family/patient expects to be discharged to:: Private residence Living Arrangements: Spouse/significant other Available Help at Discharge: Family Type of Home: House Home Access: Stairs to enter Secretary/administrator of Steps: 2 Entrance Stairs-Rails:  (uses door to pull up) Home Layout:  (split level with 2 steps to the sun room)     Bathroom Shower/Tub: Chief Strategy Officer: Handicapped height     Home Equipment: Agricultural consultant (2 wheels);Adaptive equipment Adaptive Equipment: Sock aid Additional Comments: has a bar near the bed he uses to pull up with      Prior Functioning/Environment Prior Level of Function : Independent/Modified Independent;Driving              Mobility Comments: denies falls history in last six months ADLs Comments: States ADL management is slow but he is generally independent. Wife will assist with LB dressing on occasion.        OT Problem List: Decreased strength;Decreased range of motion;Decreased knowledge of use of DME or AE;Decreased coordination;Impaired balance (sitting and/or standing)      OT Treatment/Interventions: Self-care/ADL training;Therapeutic exercise;DME and/or AE instruction;Patient/family education;Balance training;Therapeutic activities    OT Goals(Current goals can be found in the care plan section) Acute Rehab OT Goals Patient Stated Goal: To go home OT Goal Formulation: With patient Time For Goal Achievement: 10/21/23 Potential to Achieve Goals: Good ADL Goals Pt Will Perform Grooming: sitting;with modified independence Pt Will Perform Lower Body Dressing: sit to/from stand;with supervision;with set-up;with caregiver independent in assisting;with adaptive equipment Pt Will Transfer to Toilet: ambulating;with modified independence;bedside commode Pt Will Perform Toileting - Clothing Manipulation and hygiene: sit to/from stand;with modified independence;with adaptive equipment  OT Frequency: Min 1X/week    Co-evaluation              AM-PAC OT "6 Clicks" Daily Activity     Outcome Measure Help from another person eating  meals?: None Help from another person taking care of personal grooming?: None Help from another person toileting, which includes using toliet, bedpan, or urinal?: A Little Help from another person bathing (including washing, rinsing, drying)?: A Little Help from another person to put on and taking off regular upper body clothing?: A Little Help from another person to put on and taking off regular lower body clothing?: A Little 6 Click Score: 20   End of Session Equipment Utilized During Treatment: Gait belt;Rolling walker (2 wheels)  Activity Tolerance: Patient tolerated  treatment well Patient left: in chair  OT Visit Diagnosis: Other abnormalities of gait and mobility (R26.89);Pain Pain - Right/Left: Right Pain - part of body: Knee                Time: 1884-1660 OT Time Calculation (min): 20 min Charges:  OT General Charges $OT Visit: 1 Visit OT Evaluation $OT Eval Low Complexity: 1 Low OT Treatments $Self Care/Home Management : 8-22 mins  Rockney Ghee, M.S., OTR/L 10/07/23, 10:16 AM

## 2023-10-07 NOTE — Evaluation (Signed)
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 History of Parkinson's  Clinical Impression  Patient is agreeable to PT evaluation. He is independent at baseline and lives at home with his spouse.  Today, the patient required minimal assistance to stand. Gait training initiated with occasional cues for safety and improved gait kinematics. Increased independence with increased ambulation distance. Stair training completed without difficulty. Pain seems well controlled at 2/10 reported in the R knee while walking. Patient is eager to be discharged today. Mobility is adequate for discharge home with family support. PT will continue to follow if patient does not discharge as anticipated.       If plan is discharge home, recommend the following: A little help with walking and/or transfers;Help with stairs or ramp for entrance;Assist for transportation;A little help with bathing/dressing/bathroom   Can travel by private vehicle        Equipment Recommendations Rolling walker (2 wheels);BSC/3in1  Recommendations for Other Services       Functional Status Assessment Patient has had a recent decline in their functional status and demonstrates the ability to make significant improvements in function in a reasonable and predictable amount of time.     Precautions / Restrictions Precautions Precautions: Knee;Fall Precaution Booklet Issued: Yes (comment) Restrictions Weight Bearing Restrictions Per Provider Order: Yes RLE Weight Bearing Per Provider Order: Weight bearing as tolerated      Mobility  Bed Mobility Overal bed mobility: Needs Assistance Bed Mobility: Supine to Sit     Supine to sit: Contact guard, HOB elevated, Used rails     General bed mobility comments:  verbal cues for technique. increased time required    Transfers Overall transfer level: Needs assistance Equipment used: Rolling walker (2 wheels) Transfers: Sit to/from Stand Sit to Stand: Min assist           General transfer comment: lifting assistance required for standing. patient prefers to "pull up" using RUE to faciliate standing as he does from the bed at home    Ambulation/Gait Ambulation/Gait assistance: Contact guard assist, Supervision Gait Distance (Feet): 200 Feet Assistive device: Rolling walker (2 wheels) Gait Pattern/deviations: Step-through pattern, Decreased stride length Gait velocity: decreased     General Gait Details: education on sequencing of BLE and rolling walker. education on knee extension and heel toe gait pattern with carry over demonstrated. increased independence with increased ambulation distance  Stairs Stairs: Yes Stairs assistance: Supervision, Contact guard assist Stair Management: Step to pattern, Forwards, Two rails Number of Stairs: 4 General stair comments: with initial instruction, patient demonstrated correct sequencing going up/down steps with no physical assistance required  Wheelchair Mobility     Tilt Bed    Modified Rankin (Stroke Patients Only)       Balance Overall balance assessment: Needs assistance Sitting-balance support: Feet supported Sitting balance-Leahy Scale: Fair     Standing balance support: Bilateral upper extremity supported Standing balance-Leahy Scale: Fair                               Pertinent Vitals/Pain Pain Assessment Pain Assessment: 0-10 Pain Score: 2  Pain Location: R knee Pain Descriptors / Indicators: Discomfort Pain Intervention(s): Monitored during session, Limited activity within patient's tolerance, Repositioned (polar care in place at end of  session)    Home Living Family/patient expects to be discharged to:: Private residence Living Arrangements:  Spouse/significant other Available Help at Discharge: Family Type of Home: House Home Access: Stairs to enter Entrance Stairs-Rails:  (uses door to pull up) Secretary/administrator of Steps: 2   Home Layout:  (split level with 2 steps to the sun room) Home Equipment: Agricultural consultant (2 wheels);Adaptive equipment Additional Comments: has a bar near the bed he uses to pull up with    Prior Function Prior Level of Function : Independent/Modified Independent;Driving             Mobility Comments: denies falls history in last six months ADLs Comments: States ADL management is slow but he is generally independent. Wife will assist with LB dressing on occasion.     Extremity/Trunk Assessment   Upper Extremity Assessment Upper Extremity Assessment: Overall WFL for tasks assessed (Mild tremoring with functional tasks. Improves as activity progresses.)    Lower Extremity Assessment Lower Extremity Assessment: Overall WFL for tasks assessed;Defer to PT evaluation RLE Deficits / Details: patient able to weight bear without knee buckling. RLE Sensation: WNL       Communication   Communication Communication: No apparent difficulties  Cognition Arousal: Alert Behavior During Therapy: WFL for tasks assessed/performed Overall Cognitive Status: Within Functional Limits for tasks assessed                                          General Comments General comments (skin integrity, edema, etc.): education provided for positioning of RLE to promote knee ROM and avoiding resting with pillow behind the knee    Exercises Total Joint Exercises Goniometric ROM: R knee 0- 85 degrees (approximate)   Assessment/Plan    PT Assessment Patient needs continued PT services  PT Problem List Decreased strength;Decreased range of motion;Decreased activity tolerance;Decreased balance;Decreased mobility;Decreased knowledge of use of DME       PT Treatment Interventions DME  instruction;Gait training;Stair training;Functional mobility training;Therapeutic activities;Therapeutic exercise;Balance training;Neuromuscular re-education;Cognitive remediation;Patient/family education    PT Goals (Current goals can be found in the Care Plan section)  Acute Rehab PT Goals Patient Stated Goal: to go home PT Goal Formulation: With patient Time For Goal Achievement: 10/21/23 Potential to Achieve Goals: Good    Frequency BID     Co-evaluation               AM-PAC PT "6 Clicks" Mobility  Outcome Measure Help needed turning from your back to your side while in a flat bed without using bedrails?: None Help needed moving from lying on your back to sitting on the side of a flat bed without using bedrails?: A Little Help needed moving to and from a bed to a chair (including a wheelchair)?: A Little Help needed standing up from a chair using your arms (e.g., wheelchair or bedside chair)?: A Little Help needed to walk in hospital room?: A Little Help needed climbing 3-5 steps with a railing? : A Little 6 Click Score: 19    End of Session Equipment Utilized During Treatment: Gait belt Activity Tolerance: Patient tolerated treatment well Patient left: in chair;with call bell/phone within reach Nurse Communication: Mobility status PT Visit Diagnosis: Other abnormalities of gait and mobility (R26.89);Difficulty in walking, not elsewhere classified (R26.2)    Time: 1610-9604 PT Time Calculation (min) (ACUTE ONLY): 25 min   Charges:   PT Evaluation $PT  Eval Low Complexity: 1 Low PT Treatments $Gait Training: 8-22 mins PT General Charges $$ ACUTE PT VISIT: 1 Visit         Donna Bernard, PT, MPT   Ina Homes 10/07/2023, 10:27 AM

## 2023-10-07 NOTE — Discharge Summary (Signed)
Physician Discharge Summary  Subjective: 1 Day Post-Op Procedure(s) (LRB): COMPUTER ASSISTED TOTAL KNEE ARTHROPLASTY (Right) Patient reports pain as mild.   Patient seen in rounds with Dr. Ernest Pine. Patient is well, and has had no acute complaints or problems.  Denies any CP, SOB, N/B, fevers or chills Patient is ready to go home  Physician Discharge Summary  Patient ID: Alan Dennis MRN: [id number] DOB/AGE: 1941-05-07 83 y.o.  Admit date: 10/06/2023 Discharge date: 10/07/2023  Admission Diagnoses:  Discharge Diagnoses:  Principal Problem:   History of total knee arthroplasty, right   Discharged Condition: good  Hospital Course: Patient presented to the hospital on 10/06/2023 for an elective right total knee arthroplasty performed by Dr. Ernest Pine. Patient was given 1g of TXA and 2g of Ancef prior to the procedure. he  tolerated the procedure well without any complications, bone wax was used to help with hemostasis. See procedural note below for details. Postoperatively, the patient did very well. he  was able to pass PT protocols on post-op day one without any issues. JP drain was removed without any difficulty and was intact. he  was able to void his bladder without any difficulty. Physical exam was unremarkable. he  denies any SOB, CP, N/V, fevers or chills. Vital signs are stable. Patient is stable to discharge home.  PROCEDURE:  Right total knee arthroplasty using computer-assisted navigation   SURGEON:  Jena Gauss. M.D.   ASSISTANT:  Gean Birchwood, PA-C (present and scrubbed throughout the case, critical for assistance with exposure, retraction, instrumentation, and closure)   ANESTHESIA: spinal and general   ESTIMATED BLOOD LOSS: 75 mL   FLUIDS REPLACED: 900 mL of crystalloid   TOURNIQUET TIME: 113 minutes   DRAINS: 2 medium Hemovac drains   SOFT TISSUE RELEASES: Anterior cruciate ligament, posterior cruciate ligament, deep medial collateral ligament,  patellofemoral ligament   IMPLANTS UTILIZED: DePuy Attune size 9 posterior stabilized femoral component (cemented), size 9 rotating platform tibial component (cemented), 41 mm medialized dome patella (cemented), and a 5 mm stabilized rotating platform polyethylene insert.  Treatments: None  Discharge Exam: Blood pressure (!) 109/59, pulse 61, temperature 97.9 F (36.6 C), temperature source Oral, resp. rate 15, height 5' 10.5" (1.791 m), weight 81.6 kg, SpO2 95%.   Disposition: Discharge disposition: 01-Home or Self Care        Allergies as of 10/07/2023       Reactions   Rosuvastatin    Other reaction(s): Abdominal Pain Patient stated it made him feel jittery    Atorvastatin    Other reaction(s): Abdominal Pain Patient stated this made him jittery    Bactrim [sulfamethoxazole-trimethoprim] Hives   Tetracycline Rash   Lisinopril Other (See Comments)   Leg muscles ache   Sulfa Antibiotics Hives, Rash   Tetracyclines & Related Rash   Zithromax [azithromycin] Hives   Cough/ unsure        Medication List     PAUSE taking these medications    Rinvoq 15 MG Tb24 Wait to take this until: October 10, 2023 Generic drug: Upadacitinib ER Take 15 mg by mouth daily in the afternoon.       TAKE these medications    allopurinol 100 MG tablet Commonly known as: ZYLOPRIM Take 200 mg by mouth in the morning.   aspirin EC 81 MG tablet Take 1 tablet (81 mg total) by mouth in the morning and at bedtime. Swallow whole. What changed: when to take this   carbidopa-levodopa 25-100 MG tablet Commonly known as:  SINEMET IR Take 1 tablet by mouth in the morning and at bedtime.   celecoxib 200 MG capsule Commonly known as: CELEBREX Take 1 capsule (200 mg total) by mouth 2 (two) times daily.   cholecalciferol 25 MCG (1000 UNIT) tablet Commonly known as: VITAMIN D3 Take 1,000 Units by mouth in the morning.   cyanocobalamin 1000 MCG tablet Commonly known as: VITAMIN B12 Take  1,000 mcg by mouth in the morning.   ferrous sulfate 325 (65 FE) MG EC tablet Take 325 mg by mouth in the morning.   hydrOXYzine 25 MG tablet Commonly known as: ATARAX Take 25 mg by mouth at bedtime as needed (sleep).   latanoprost 0.005 % ophthalmic solution Commonly known as: XALATAN Place 1 drop into both eyes at bedtime.   oxyCODONE 5 MG immediate release tablet Commonly known as: Oxy IR/ROXICODONE Take 1 tablet (5 mg total) by mouth every 4 (four) hours as needed for moderate pain (pain score 4-6) (pain score 4-6).   traMADol 50 MG tablet Commonly known as: ULTRAM Take 1-2 tablets (50-100 mg total) by mouth every 4 (four) hours as needed for moderate pain (pain score 4-6).   Vitamin D (Ergocalciferol) 1.25 MG (50000 UNIT) Caps capsule Commonly known as: DRISDOL Take 50,000 Units by mouth every Wednesday.               Durable Medical Equipment  (From admission, onward)           Start     Ordered   10/06/23 1726  DME Walker rolling  Once       Question:  Patient needs a walker to treat with the following condition  Answer:  Total knee replacement status   10/06/23 1725   10/06/23 1726  DME Bedside commode  Once       Comments: Patient is not able to walk the distance required to go the bathroom, or he/she is unable to safely negotiate stairs required to access the bathroom.  A 3in1 BSC will alleviate this problem  Question:  Patient needs a bedside commode to treat with the following condition  Answer:  Total knee replacement status   10/06/23 1725            Follow-up Information     Rayburn Go, PA-C Follow up on 10/21/2023.   Specialty: Orthopedic Surgery Why: at 10:00am Contact information: [address] [place] [address] [phone]         Donato Heinz, MD Follow up on 11/18/2023.   Specialty: Orthopedic Surgery Why: at 2:45pm Contact information: [address] [facility] [place]  [address] [phone]                 Signed: Gean Birchwood 10/07/2023, 8:35 AM   Objective: Vital signs in last 24 hours: Temp:  [96.8 F (36 C)-98 F (36.7 C)] 97.9 F (36.6 C) (01/21 0738) Pulse Rate:  [61-91] 61 (01/21 0738) Resp:  [6-20] 15 (01/21 0738) BP: (104-155)/(59-98) 109/59 (01/21 0738) SpO2:  [95 %-100 %] 95 % (01/21 0738) Weight:  [81.6 kg] 81.6 kg (01/20 1034)  Intake/Output from previous day:  Intake/Output Summary (Last 24 hours) at 10/07/2023 0835 Last data filed at 10/07/2023 0440 Gross per 24 hour  Intake 2250 ml  Output 905 ml  Net 1345 ml    Intake/Output this shift: No intake/output data recorded.  Labs: No results for input(s): "HGB" in the last 72 hours. No results for input(s): "WBC", "RBC", "HCT", "PLT"  in the last 72 hours. No results for input(s): "NA", "K", "CL", "CO2", "BUN", "CREATININE", "GLUCOSE", "CALCIUM" in the last 72 hours. No results for input(s): "LABPT", "INR" in the last 72 hours.  EXAM: General - Patient is Alert, Appropriate, and Oriented Extremity - Neurologically intact ABD soft Neurovascular intact Sensation intact distally Intact pulses distally Dorsiflexion/Plantar flexion intact No cellulitis present Compartment soft Incision - clean, dry, intact without any drainage appreciated Motor Function -able to plantar and dorsiflex with good strength and range of motion as well as wiggle his toes.  Is neurovascularly intact all dermatomes extending down his right lower extremity.  Posterior tibial pulses were appreciated.  JP drain was pulled without difficulty, intact.  Assessment/Plan: 1 Day Post-Op Procedure(s) (LRB): COMPUTER ASSISTED TOTAL KNEE ARTHROPLASTY (Right) Procedure(s) (LRB): COMPUTER ASSISTED TOTAL KNEE ARTHROPLASTY (Right) Past Medical History:  Diagnosis Date   AAA (abdominal aortic aneurysm) (HCC)    Anemia    Aneurysm of right common iliac artery (HCC)    Arthritis    Bilateral carotid  artery stenosis    COVID-19 2022   Degenerative arthritis of right knee    Frequent PVCs    GERD (gastroesophageal reflux disease)    Glaucoma    Gout    History of hiatal hernia    2018   Hyperlipidemia    Hypertension    Panic attacks    Parkinson disease (HCC)    Peripheral vascular disease (HCC)    Sciatica    Stroke (HCC) 2015   TIA - no deficits   Syncopal episodes 2022   Vitamin D deficiency    Principal Problem:   History of total knee arthroplasty, right  Estimated body mass index is 25.46 kg/m as calculated from the following:   Height as of this encounter: 5' 10.5" (1.791 m).   Weight as of this encounter: 81.6 kg.  Patient will look to transition to working with home health physical therapy.  Continue to work on ROM, gait and strength with the right lower extremity  Discussed with the patient continuing to utilize Polar Care   Patient will use bone foam in 20-30 minute intervals   Patient will wear TED hose bilaterally to help prevent DVT and clot formation   Discussed the Aquacel bandage.  This bandage will stay in place 7 days postoperatively.  Can be replaced with honeycomb bandages that will be sent home with the patient   Discussed sending the patient home with tramadol and oxycodone for as needed pain management.  Patient will also be sent home with Celebrex to help with swelling and inflammation.  Patient will take an 81 mg aspirin twice daily for DVT prophylaxis   JP drain removed without difficulty, intact   Weight-Bearing as tolerated to right leg   Patient will follow-up with [facility] orthopedics in 2 weeks for staple removal and reevaluation  Diet - Regular diet Follow up - in 2 weeks Activity - WBAT Disposition - Home Condition Upon Discharge - Good DVT Prophylaxis - Aspirin and TED hose  Danise Edge, PA-C Orthopaedic Surgery 10/07/2023, 8:35 AM

## 2023-10-07 NOTE — Progress Notes (Signed)
DISCHARGE NOTE:   Pt discharged with IV removed and education given. Pt voices no concerns or questions at this time. Pt given 2 honeycomb dressing, medication scripts, and TED hose on and in place. Pt wheeled down to medical mall entrance and pt's wife provided transportation.

## 2023-10-07 NOTE — Anesthesia Postprocedure Evaluation (Signed)
Anesthesia Post Note  Patient: Alan Dennis  Procedure(s) Performed: COMPUTER ASSISTED TOTAL KNEE ARTHROPLASTY (Right: Knee)  Patient location during evaluation: Nursing Unit Anesthesia Type: Spinal Level of consciousness: awake and alert and oriented Pain management: pain level controlled Vital Signs Assessment: post-procedure vital signs reviewed and stable Respiratory status: respiratory function stable Cardiovascular status: stable Postop Assessment: no headache, patient able to bend at knees, no apparent nausea or vomiting, able to ambulate and adequate PO intake Anesthetic complications: no   No notable events documented.   Last Vitals:  Vitals:   10/07/23 0422 10/07/23 0738  BP: (!) 109/59 (!) 109/59  Pulse: 83 61  Resp: 16 15  Temp: 36.5 C 36.6 C  SpO2: 98% 95%    Last Pain:  Vitals:   10/07/23 0738  TempSrc: Oral  PainSc:                  Zachary George

## 2023-10-07 NOTE — Progress Notes (Signed)
Subjective: 1 Day Post-Op Procedure(s) (LRB): COMPUTER ASSISTED TOTAL KNEE ARTHROPLASTY (Right) Patient reports pain as mild.   Patient seen in rounds with Dr. Ernest Pine. Patient is well, and has had no acute complaints or problems.  Denies any CP, SOB, N/B, fevers or chills We will start therapy today.  Plan is to go Home after hospital stay.  Objective: Vital signs in last 24 hours: Temp:  [96.8 F (36 C)-98 F (36.7 C)] 97.9 F (36.6 C) (01/21 0738) Pulse Rate:  [61-91] 61 (01/21 0738) Resp:  [6-20] 15 (01/21 0738) BP: (104-155)/(59-98) 109/59 (01/21 0738) SpO2:  [95 %-100 %] 95 % (01/21 0738) Weight:  [81.6 kg] 81.6 kg (01/20 1034)  Intake/Output from previous day:  Intake/Output Summary (Last 24 hours) at 10/07/2023 0829 Last data filed at 10/07/2023 0440 Gross per 24 hour  Intake 2250 ml  Output 905 ml  Net 1345 ml    Intake/Output this shift: No intake/output data recorded.  Labs: No results for input(s): "HGB" in the last 72 hours. No results for input(s): "WBC", "RBC", "HCT", "PLT" in the last 72 hours. No results for input(s): "NA", "K", "CL", "CO2", "BUN", "CREATININE", "GLUCOSE", "CALCIUM" in the last 72 hours. No results for input(s): "LABPT", "INR" in the last 72 hours.  EXAM General - Patient is Alert, Appropriate, and Oriented Extremity - Neurologically intact ABD soft Neurovascular intact Sensation intact distally Intact pulses distally Dorsiflexion/Plantar flexion intact No cellulitis present Compartment soft Dressing - dressing C/D/I and no drainage Motor Function - intact, moving foot and toes well on exam.  Able to plantar and dorsiflex with good strength and range of motion.  Neurovascular intact all dermatomes extending down his right lower extremity.  Posterior tibial pulses appreciated. JP Drain pulled without difficulty. Intact  Past Medical History:  Diagnosis Date   AAA (abdominal aortic aneurysm) (HCC)    Anemia    Aneurysm of right  common iliac artery (HCC)    Arthritis    Bilateral carotid artery stenosis    COVID-19 2022   Degenerative arthritis of right knee    Frequent PVCs    GERD (gastroesophageal reflux disease)    Glaucoma    Gout    History of hiatal hernia    2018   Hyperlipidemia    Hypertension    Panic attacks    Parkinson disease (HCC)    Peripheral vascular disease (HCC)    Sciatica    Stroke (HCC) 2015   TIA - no deficits   Syncopal episodes 2022   Vitamin D deficiency     Assessment/Plan: 1 Day Post-Op Procedure(s) (LRB): COMPUTER ASSISTED TOTAL KNEE ARTHROPLASTY (Right) Principal Problem:   History of total knee arthroplasty, right  Estimated body mass index is 25.46 kg/m as calculated from the following:   Height as of this encounter: 5' 10.5" (1.791 m).   Weight as of this encounter: 81.6 kg. Advance diet Up with therapy  Patient will continue to work with physical therapy to pass postoperative PT protocols, ROM and strengthening  Discussed with the patient continuing to utilize Polar Care  Patient will use bone foam in 20-30 minute intervals  Patient will wear TED hose bilaterally to help prevent DVT and clot formation  Discussed the Aquacel bandage.  This bandage will stay in place 7 days postoperatively.  Can be replaced with honeycomb bandages that will be sent home with the patient  Discussed sending the patient home with tramadol and oxycodone for as needed pain management.  Patient will also  be sent home with Celebrex to help with swelling and inflammation.  Patient will take an 81 mg aspirin twice daily for DVT prophylaxis  JP drain removed without difficulty, intact  Weight-Bearing as tolerated to right leg  Patient will follow-up with [facility] orthopedics in 2 weeks for staple removal and reevaluation  Rayburn Go, PA-C [facility] Orthopaedics 10/07/2023, 8:29 AM

## 2023-10-07 NOTE — TOC Initial Note (Signed)
Transition of Care [facility]) - Initial/Assessment Note    Patient Details  Name: Alan Dennis MRN: [id number] Date of Birth: 1941/07/26  Transition of [facility] CM/SW Contact:    Marlowe Sax, RN Phone Number: 10/07/2023, 11:17 AM  Clinical Narrative:                 The patient is set up with [facility] for Home health services prior to surgery by surgeons office RW and 3 in1 to be delivered to the bedside by adapt        Patient Goals and CMS Choice            Expected Discharge Plan and Services         Expected Discharge Date: 10/07/23                                    Prior Living Arrangements/Services                       Activities of Daily Living   ADL Screening (condition at time of admission) Independently performs ADLs?: Yes (appropriate for developmental age) Is the patient deaf or have difficulty hearing?: No Does the patient have difficulty seeing, even when wearing glasses/contacts?: No Does the patient have difficulty concentrating, remembering, or making decisions?: No  Permission Sought/Granted                  Emotional Assessment              Admission diagnosis:  History of total knee arthroplasty, right [Z96.651] Patient Active Problem List   Diagnosis Date Noted   History of total knee arthroplasty, right 10/06/2023   Primary osteoarthritis of right knee 04/21/2023   Idiopathic chronic gout of left foot without tophus 05/27/2022   Aneurysm of right common iliac artery (HCC) 10/24/2021   Parkinson disease (HCC) 02/10/2021   Gastroesophageal reflux disease 11/29/2020   Bilateral carotid artery stenosis 11/29/2020   Hyperlipidemia, mixed 11/29/2020   SOBOE (shortness of breath on exertion) 11/29/2020   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/07/2020   Panic anxiety syndrome 11/07/2020   Taking a statin medication 11/07/2020   Near syncope 11/03/2020   Left hand weakness 10/23/2020   Personal history of gout 10/23/2020    AAA (abdominal aortic aneurysm) (HCC) 10/14/2014   Essential hypertension 10/14/2014   Low vitamin D level 10/14/2014   History of stroke 08/03/2014   Back pain 04/29/2014   Pain of left lower extremity 04/29/2014   Sciatica 04/29/2014   Stroke, recent, without late effect 04/29/2014   Carotid artery disease (HCC) 04/07/2014   PCP:  Barbette Reichmann, MD Pharmacy:   [facility] [phone] - [place], [place] - [address] [place] [address] Phone: [phone] Fax: [phone]     Social Drivers of Health (SDOH) Social History: SDOH Screenings   Food Insecurity: No Food Insecurity (10/06/2023)  Housing: Unknown (10/06/2023)  Transportation Needs: No Transportation Needs (10/06/2023)  Utilities: Not At Risk (10/06/2023)  Financial Resource Strain: Low Risk  (12/24/2022)   [facility] from [facility]  Social Connections: Moderately Isolated (10/06/2023)  Tobacco Use: Medium Risk (10/06/2023)   SDOH Interventions:     Readmission Risk Interventions     No data to display

## 2023-10-21 ENCOUNTER — Ambulatory Visit
Admission: RE | Admit: 2023-10-21 | Discharge: 2023-10-21 | Disposition: A | Discharge status: Home / Self Care | Payer: Medicare PPO | Source: Ambulatory Visit | Attending: Student | Admitting: Student

## 2023-10-21 ENCOUNTER — Other Ambulatory Visit: Payer: Self-pay | Admitting: Student

## 2023-10-21 DIAGNOSIS — M7989 Other specified soft tissue disorders: Secondary | ICD-10-CM | POA: Insufficient documentation

## 2023-10-21 DIAGNOSIS — M1711 Unilateral primary osteoarthritis, right knee: Secondary | ICD-10-CM | POA: Insufficient documentation

## 2023-10-21 DIAGNOSIS — Z96651 Presence of right artificial knee joint: Secondary | ICD-10-CM | POA: Diagnosis present

## 2023-12-25 ENCOUNTER — Ambulatory Visit: Admitting: Urology

## 2023-12-25 VITALS — BP 156/82 | HR 76 | Ht 70.5 in | Wt 175.5 lb

## 2023-12-25 DIAGNOSIS — R972 Elevated prostate specific antigen [PSA]: Secondary | ICD-10-CM

## 2023-12-25 DIAGNOSIS — R634 Abnormal weight loss: Secondary | ICD-10-CM | POA: Diagnosis not present

## 2023-12-25 NOTE — Progress Notes (Signed)
 Marcelle Overlie Plume,acting as a scribe for Vanna Scotland, MD.,have documented all relevant documentation on the behalf of Vanna Scotland, MD,as directed by  Vanna Scotland, MD while in the presence of Vanna Scotland, MD.  12/25/23 11:29 AM   Alan Dennis 11-Jan-1941 [id number]  Referring provider: Barbette Reichmann, MD [address] [facility] [place],  [place] [address]  Chief Complaint  Patient presents with   Establish Care   Elevated PSA    HPI: 83 year old male with a personal history of elevated PSA presents today for further evaluation. He was referred due to a PSA level of 6.08, which was measured on 12-19-2023.   His PSA level has increased from 5 to 6 over the past year, which is not considered a significant change.   He also mentioned experiencing phlegm in the throat, weight loss, and concerns about his Parkinson's and a past issue with a sciatic nerve.  He denies any urinary issues.  He feels that he voids well.  No dysuria or gross hematuria.  No UTIs.  PMH: Past Medical History:  Diagnosis Date   AAA (abdominal aortic aneurysm) (HCC)    Anemia    Aneurysm of right common iliac artery (HCC)    Arthritis    Bilateral carotid artery stenosis    COVID-19 2022   Degenerative arthritis of right knee    Frequent PVCs    GERD (gastroesophageal reflux disease)    Glaucoma    Gout    History of hiatal hernia    2018   Hyperlipidemia    Hypertension    Panic attacks    Parkinson disease (HCC)    Peripheral vascular disease (HCC)    Sciatica    Stroke (HCC) 2015   TIA - no deficits   Syncopal episodes 2022   Vitamin D deficiency     Surgical History: Past Surgical History:  Procedure Laterality Date   ABDOMINAL AORTIC ANEURYSM REPAIR  2005   BACK SURGERY     spur removal, (also, major back surg in 1970s)   COLONOSCOPY     ESOPHAGOGASTRODUODENOSCOPY (EGD) WITH PROPOFOL N/A 05/08/2017   Procedure: ESOPHAGOGASTRODUODENOSCOPY (EGD)  WITH PROPOFOL;  Surgeon: Toney Reil, MD;  Location: [facility];  Service: Gastroenterology;  Laterality: N/A;   EYE SURGERY     bilateral   GANGLION CYST EXCISION Left 05/22/2017   Procedure: REMOVAL GANGLION OF WRIST;  Surgeon: Kennedy Bucker, MD;  Location: [facility] OR[facility];  Service: Orthopedics;  Laterality: Left;   IMAGE GUIDED SINUS SURGERY N/A 02/20/2017   Procedure: IMAGE GUIDED SINUS SURGERY;  Surgeon: Vernie Murders, MD;  Location: [facility];  Service: ENT;  Laterality: N/A;  need disk GAVE DISK TO [facility] 5-15 KP   KNEE ARTHROPLASTY Right 10/06/2023   Procedure: COMPUTER ASSISTED TOTAL KNEE ARTHROPLASTY;  Surgeon: Donato Heinz, MD;  Location: [facility];  Service: Orthopedics;  Laterality: Right;   NASAL SINUS SURGERY     several   SHOULDER SURGERY Right    SINUSOTOMY Right 02/20/2017   Procedure: SINUSOTOMY right endoscopic with removal content;  Surgeon: Vernie Murders, MD;  Location: [facility];  Service: ENT;  Laterality: Right;    Home Medications:  Allergies as of 12/25/2023       Reactions   Rosuvastatin    Other reaction(s): Abdominal Pain Patient stated it made him feel jittery    Atorvastatin    Other reaction(s): Abdominal Pain Patient stated this made him jittery  Bactrim [sulfamethoxazole-trimethoprim] Hives   Tetracycline Rash   Lisinopril Other (See Comments)   Leg muscles ache   Sulfa Antibiotics Hives, Rash   Tetracyclines & Related Rash   Zithromax [azithromycin] Hives   Cough/ unsure        Medication List        Accurate as of December 25, 2023 11:29 AM. If you have any questions, ask your nurse or doctor.          STOP taking these medications    celecoxib 200 MG capsule Commonly known as: CELEBREX   oxyCODONE 5 MG immediate release tablet Commonly known as: Oxy IR/ROXICODONE   traMADol 50 MG tablet Commonly known as: ULTRAM       TAKE these medications    allopurinol 100 MG tablet Commonly known as:  ZYLOPRIM Take 200 mg by mouth in the morning.   aspirin EC 81 MG tablet Take 1 tablet (81 mg total) by mouth in the morning and at bedtime. Swallow whole.   carbidopa-levodopa 25-100 MG tablet Commonly known as: SINEMET IR Take 1 tablet by mouth in the morning and at bedtime.   cholecalciferol 25 MCG (1000 UNIT) tablet Commonly known as: VITAMIN D3 Take 1,000 Units by mouth in the morning.   cyanocobalamin 1000 MCG tablet Commonly known as: VITAMIN B12 Take 1,000 mcg by mouth in the morning.   ferrous sulfate 325 (65 FE) MG EC tablet Take 325 mg by mouth in the morning.   hydrOXYzine 25 MG tablet Commonly known as: ATARAX Take 25 mg by mouth at bedtime as needed (sleep).   latanoprost 0.005 % ophthalmic solution Commonly known as: XALATAN Place 1 drop into both eyes at bedtime.   Rinvoq 15 MG Tb24 Generic drug: Upadacitinib ER Take 15 mg by mouth daily in the afternoon.   Vitamin D (Ergocalciferol) 1.25 MG (50000 UNIT) Caps capsule Commonly known as: DRISDOL Take 50,000 Units by mouth every Wednesday.        Allergies:  Allergies  Allergen Reactions   Rosuvastatin     Other reaction(s): Abdominal Pain Patient stated it made him feel jittery    Atorvastatin     Other reaction(s): Abdominal Pain Patient stated this made him jittery    Bactrim [Sulfamethoxazole-Trimethoprim] Hives   Tetracycline Rash   Lisinopril Other (See Comments)    Leg muscles ache   Sulfa Antibiotics Hives and Rash   Tetracyclines & Related Rash   Zithromax [Azithromycin] Hives    Cough/ unsure    Family History: Family History  Problem Relation Age of Onset   Cancer Father    Hypertension Sister    Diabetes Sister    Hypertension Brother    Diabetes Brother    Parkinson's disease Brother     Social History:  reports that he quit smoking about 40 years ago. His smoking use included cigarettes. He has never used smokeless tobacco. He reports current alcohol use of about 14.0  standard drinks of alcohol per week. He reports that he does not use drugs.   Physical Exam: BP (!) 156/82   Pulse 76   Ht 5' 10.5" (1.791 m)   Wt 175 lb 8 oz (79.6 kg)   BMI 24.83 kg/m   Constitutional:  Alert and oriented, No acute distress. HEENT: [place] AT, moist mucus membranes.  Trachea midline, no masses. Neurologic: Grossly intact, no focal deficits, moving all 4 extremities. Psychiatric: Normal mood and affect.   Assessment & Plan:    1.  Elevated PSA -  We reviewed the implications of an elevated PSA and the uncertainty surrounding it. In general, a man's PSA increases with age and is produced by both normal and cancerous prostate tissue. Differential for elevated PSA is BPH, prostate cancer, infection, recent intercourse/ejaculation, prostate infarction, recent urethroscopic manipulation (foley placement/cystoscopy) and prostatitis. Management of an elevated PSA can include observation or prostate biopsy and wediscussed this in detail. We discussed that indications for prostate biopsy are defined by age and race specific PSA cutoffs as well as a PSA velocity of 0.75/year.  - His PSA level is 6.08, which is considered appropriate for his age of 83. The PSA has increased from 5 to 6 over the past year, but this is not indicative of aggressive prostate cancer, as aggressive cancer would typically cause the PSA to double in six months to a year.  - Given his age and the slow progression of prostate cancer in older individuals, further intervention such as biopsy or treatment is not recommended.  - The potential risks of treatment outweigh the benefits.  - The recommendation is to discontinue PSA screening unless he desires to continue.  - If PSA levels double in less than a year, further evaluation may be warranted.  2.  Weight Loss - His weight loss is not attributed to prostate cancer, as a PSA of 6 would indicate localized cancer, which would not cause systemic symptoms like weight  loss.  - Further investigation into other causes of weight loss may be necessary if he continues to experience this symptom.  Return in about 1 year (around 12/24/2024) for repeat PSA if desired or sooner if any concerning changes in symptoms.  I have reviewed the above documentation for accuracy and completeness, and I agree with the above.   Vanna Scotland, MD   [facility] [address], [address] [place], [place] [address] [phone]

## 2024-02-03 ENCOUNTER — Encounter (INDEPENDENT_AMBULATORY_CARE_PROVIDER_SITE_OTHER): Payer: Self-pay
